# Patient Record
Sex: Male | Born: 1945 | Race: Black or African American | Hispanic: No | Marital: Married | Smoking: Current every day smoker
Health system: Southern US, Community
[De-identification: ages and names within clinical notes are randomized; demographics above are authoritative.]

## PROBLEM LIST (undated history)

## (undated) DIAGNOSIS — F1010 Alcohol abuse, uncomplicated: Secondary | ICD-10-CM

## (undated) DIAGNOSIS — I10 Essential (primary) hypertension: Secondary | ICD-10-CM

## (undated) DIAGNOSIS — Z972 Presence of dental prosthetic device (complete) (partial): Secondary | ICD-10-CM

## (undated) DIAGNOSIS — I219 Acute myocardial infarction, unspecified: Secondary | ICD-10-CM

## (undated) DIAGNOSIS — S02609A Fracture of mandible, unspecified, initial encounter for closed fracture: Secondary | ICD-10-CM

## (undated) DIAGNOSIS — R0602 Shortness of breath: Secondary | ICD-10-CM

## (undated) DIAGNOSIS — Z720 Tobacco use: Secondary | ICD-10-CM

## (undated) DIAGNOSIS — K219 Gastro-esophageal reflux disease without esophagitis: Secondary | ICD-10-CM

## (undated) DIAGNOSIS — Z973 Presence of spectacles and contact lenses: Secondary | ICD-10-CM

## (undated) HISTORY — PX: HERNIA REPAIR: SHX51

## (undated) HISTORY — PX: COLONOSCOPY: SHX174

## (undated) HISTORY — PX: OTHER SURGICAL HISTORY: SHX169

---

## 1986-11-08 DIAGNOSIS — I219 Acute myocardial infarction, unspecified: Secondary | ICD-10-CM

## 1986-11-08 HISTORY — DX: Acute myocardial infarction, unspecified: I21.9

## 1986-11-08 HISTORY — PX: CORONARY ANGIOPLASTY: SHX604

## 1998-06-29 ENCOUNTER — Emergency Department (HOSPITAL_COMMUNITY): Admission: EM | Admit: 1998-06-29 | Discharge: 1998-06-29 | Payer: Self-pay | Admitting: Emergency Medicine

## 1999-09-07 ENCOUNTER — Encounter: Payer: Self-pay | Admitting: Cardiology

## 1999-09-07 ENCOUNTER — Inpatient Hospital Stay (HOSPITAL_COMMUNITY): Admission: EM | Admit: 1999-09-07 | Discharge: 1999-09-09 | Payer: Self-pay | Admitting: Emergency Medicine

## 1999-11-16 ENCOUNTER — Ambulatory Visit (HOSPITAL_COMMUNITY): Admission: RE | Admit: 1999-11-16 | Discharge: 1999-11-16 | Payer: Self-pay | Admitting: Cardiology

## 1999-11-16 ENCOUNTER — Encounter: Payer: Self-pay | Admitting: Cardiology

## 2001-06-16 ENCOUNTER — Encounter: Payer: Self-pay | Admitting: Cardiology

## 2001-06-16 ENCOUNTER — Encounter: Admission: RE | Admit: 2001-06-16 | Discharge: 2001-06-16 | Payer: Self-pay | Admitting: Cardiology

## 2003-01-16 ENCOUNTER — Encounter: Payer: Self-pay | Admitting: Cardiology

## 2003-01-16 ENCOUNTER — Ambulatory Visit (HOSPITAL_COMMUNITY): Admission: RE | Admit: 2003-01-16 | Discharge: 2003-01-16 | Payer: Self-pay | Admitting: Cardiology

## 2004-07-08 ENCOUNTER — Encounter: Admission: RE | Admit: 2004-07-08 | Discharge: 2004-07-08 | Payer: Self-pay | Admitting: Cardiology

## 2005-05-03 ENCOUNTER — Ambulatory Visit (HOSPITAL_COMMUNITY): Admission: RE | Admit: 2005-05-03 | Discharge: 2005-05-03 | Payer: Self-pay

## 2006-08-18 ENCOUNTER — Ambulatory Visit (HOSPITAL_COMMUNITY): Admission: RE | Admit: 2006-08-18 | Discharge: 2006-08-18 | Payer: Self-pay | Admitting: Cardiology

## 2006-08-22 ENCOUNTER — Encounter: Admission: RE | Admit: 2006-08-22 | Discharge: 2006-08-22 | Payer: Self-pay | Admitting: Cardiology

## 2006-12-15 ENCOUNTER — Ambulatory Visit (HOSPITAL_COMMUNITY): Admission: RE | Admit: 2006-12-15 | Discharge: 2006-12-15 | Payer: Self-pay | Admitting: Cardiology

## 2010-11-29 ENCOUNTER — Encounter: Payer: Self-pay | Admitting: Cardiology

## 2014-02-19 ENCOUNTER — Other Ambulatory Visit: Payer: Self-pay | Admitting: Cardiology

## 2014-02-19 DIAGNOSIS — R066 Hiccough: Secondary | ICD-10-CM

## 2014-02-19 DIAGNOSIS — R1310 Dysphagia, unspecified: Secondary | ICD-10-CM

## 2014-02-20 ENCOUNTER — Ambulatory Visit
Admission: RE | Admit: 2014-02-20 | Discharge: 2014-02-20 | Disposition: A | Discharge status: Home / Self Care | Payer: Medicare PPO | Source: Ambulatory Visit | Attending: Cardiology | Admitting: Cardiology

## 2014-02-20 DIAGNOSIS — R066 Hiccough: Secondary | ICD-10-CM

## 2014-02-20 DIAGNOSIS — R1310 Dysphagia, unspecified: Secondary | ICD-10-CM

## 2014-02-25 ENCOUNTER — Other Ambulatory Visit: Payer: Self-pay | Admitting: Cardiology

## 2014-02-25 DIAGNOSIS — IMO0002 Reserved for concepts with insufficient information to code with codable children: Secondary | ICD-10-CM

## 2014-02-25 DIAGNOSIS — R229 Localized swelling, mass and lump, unspecified: Principal | ICD-10-CM

## 2014-03-01 ENCOUNTER — Encounter (INDEPENDENT_AMBULATORY_CARE_PROVIDER_SITE_OTHER): Payer: Self-pay

## 2014-03-01 ENCOUNTER — Ambulatory Visit
Admission: RE | Admit: 2014-03-01 | Discharge: 2014-03-01 | Disposition: A | Discharge status: Home / Self Care | Payer: Medicare PPO | Source: Ambulatory Visit | Attending: Cardiology | Admitting: Cardiology

## 2014-03-01 DIAGNOSIS — IMO0002 Reserved for concepts with insufficient information to code with codable children: Secondary | ICD-10-CM

## 2014-03-01 DIAGNOSIS — R229 Localized swelling, mass and lump, unspecified: Principal | ICD-10-CM

## 2014-03-20 ENCOUNTER — Encounter (HOSPITAL_COMMUNITY): Payer: Self-pay | Admitting: Pharmacy Technician

## 2014-03-20 ENCOUNTER — Other Ambulatory Visit: Payer: Self-pay | Admitting: Gastroenterology

## 2014-03-21 ENCOUNTER — Encounter (HOSPITAL_COMMUNITY): Payer: Self-pay | Admitting: *Deleted

## 2014-04-02 ENCOUNTER — Encounter (HOSPITAL_COMMUNITY): Payer: Self-pay | Admitting: *Deleted

## 2014-04-02 ENCOUNTER — Ambulatory Visit (HOSPITAL_COMMUNITY)
Admission: RE | Admit: 2014-04-02 | Discharge: 2014-04-02 | Disposition: A | Discharge status: Home / Self Care | Payer: Medicare PPO | Source: Ambulatory Visit | Attending: Gastroenterology | Admitting: Gastroenterology

## 2014-04-02 ENCOUNTER — Encounter (HOSPITAL_COMMUNITY)
Admission: RE | Disposition: A | Discharge status: Home / Self Care | Payer: Self-pay | Source: Ambulatory Visit | Attending: Gastroenterology

## 2014-04-02 DIAGNOSIS — R1310 Dysphagia, unspecified: Secondary | ICD-10-CM | POA: Insufficient documentation

## 2014-04-02 DIAGNOSIS — K449 Diaphragmatic hernia without obstruction or gangrene: Secondary | ICD-10-CM | POA: Insufficient documentation

## 2014-04-02 DIAGNOSIS — E7800 Pure hypercholesterolemia, unspecified: Secondary | ICD-10-CM | POA: Insufficient documentation

## 2014-04-02 DIAGNOSIS — I10 Essential (primary) hypertension: Secondary | ICD-10-CM | POA: Insufficient documentation

## 2014-04-02 HISTORY — DX: Gastro-esophageal reflux disease without esophagitis: K21.9

## 2014-04-02 HISTORY — PX: BALLOON DILATION: SHX5330

## 2014-04-02 HISTORY — DX: Acute myocardial infarction, unspecified: I21.9

## 2014-04-02 HISTORY — DX: Essential (primary) hypertension: I10

## 2014-04-02 HISTORY — DX: Shortness of breath: R06.02

## 2014-04-02 HISTORY — PX: ESOPHAGOGASTRODUODENOSCOPY: SHX5428

## 2014-04-02 SURGERY — EGD (ESOPHAGOGASTRODUODENOSCOPY)
Anesthesia: Moderate Sedation

## 2014-04-02 MED ORDER — MIDAZOLAM HCL 10 MG/2ML IJ SOLN
INTRAMUSCULAR | Status: DC | PRN
  Administered 2014-04-02 (×2): 2 mg via INTRAVENOUS

## 2014-04-02 MED ORDER — MIDAZOLAM HCL 10 MG/2ML IJ SOLN
INTRAMUSCULAR | Status: AC
  Filled 2014-04-02: qty 2

## 2014-04-02 MED ORDER — FENTANYL CITRATE 0.05 MG/ML IJ SOLN
INTRAMUSCULAR | Status: DC | PRN
  Administered 2014-04-02 (×2): 25 ug via INTRAVENOUS

## 2014-04-02 MED ORDER — FENTANYL CITRATE 0.05 MG/ML IJ SOLN
INTRAMUSCULAR | Status: AC
  Filled 2014-04-02: qty 2

## 2014-04-02 MED ORDER — BUTAMBEN-TETRACAINE-BENZOCAINE 2-2-14 % EX AERO
INHALATION_SPRAY | CUTANEOUS | Status: DC | PRN
  Administered 2014-04-02: 2 via TOPICAL

## 2014-04-02 MED ORDER — DIPHENHYDRAMINE HCL 50 MG/ML IJ SOLN
INTRAMUSCULAR | Status: AC
  Filled 2014-04-02: qty 1

## 2014-04-02 MED ORDER — SODIUM CHLORIDE 0.9 % IV SOLN
INTRAVENOUS | Status: DC
  Administered 2014-04-02: 500 mL via INTRAVENOUS

## 2014-04-02 NOTE — H&P (Signed)
  Problem: Dysphagia. Distal esophageal stricture seen on barium esophagram  History: The patient is a 68 year old male born July 01, 1946. He underwent a barium esophagram with barium tablet swallow to evaluate esophageal dysphagia unassociated with heartburn, odynophagia, or weight loss. Barium esophagram showed a short segment distal esophageal narrowing.  The patient is scheduled to undergo diagnostic esophagogastroduodenoscopy with distal esophageal stricture dilation.  Medication allergies: None  Chronic medications: Simvastatin. Amlodipine. Diovan. Benzapril.  Past medical and surgical history: Hypertension. Hypercholesterolemia.  Exam: The patient is alert and lying comfortably on the endoscopy stretcher. Abdomen is soft and nontender to palpation. Lungs are clear to auscultation. Cardiac exam reveals a regular rhythm.  Plan: Proceed with diagnostic esophagogastroduodenoscopy and distal esophageal stricture dilation

## 2014-04-02 NOTE — Op Note (Signed)
Problem: Dysphagia. Barium esophagram showed smooth narrowing of the esophageal lumen at the esophagogastric junction  Endoscopist: Danise Edge  Premedication: Versed 5 mg. Fentanyl 50 mcg  Procedure: Diagnostic esophagogastroduodenoscopy The patient was placed in the left lateral decubitus position. Pentax gastroscope was passed through the posterior hypopharynx into the proximal esophagus without difficulty. The hypopharynx, larynx, and vocal cords appeared normal.  Esophagoscopy: The proximal, mid, and lower segments of the esophageal mucosa appeared normal. The squamocolumnar junction was regular in appearance and noted at 40 cm from the incisor teeth. There was no endoscopic evidence for the presence of esophageal stricture formation or esophageal lumen narrowing.  Gastroscopy: Retroflex view of the gastric cardia and fundus was normal. A medium sized hiatal hernia was present. The gastric body, antrum, and pylorus appeared normal.  Duodenoscopy: The duodenal bulb and descending duodenum appeared normal.  Assessment: Normal esophagogastroduodenoscopy  Recommendation: If esophageal dysphagia persists, schedule diagnostic high resolution esophageal manometry

## 2014-04-02 NOTE — Discharge Instructions (Signed)
Gastrointestinal Endoscopy °Care After °Refer to this sheet in the next few weeks. These instructions provide you with information on caring for yourself after your procedure. Your caregiver may also give you more specific instructions. Your treatment has been planned according to current medical practices, but problems sometimes occur. Call your caregiver if you have any problems or questions after your procedure. °HOME CARE INSTRUCTIONS °· If you were given medicine to help you relax (sedative), do not drive, operate machinery, or sign important documents for 24 hours. °· Avoid alcohol and hot or warm beverages for the first 24 hours after the procedure. °· Only take over-the-counter or prescription medicines for pain, discomfort, or fever as directed by your caregiver. You may resume taking your normal medicines unless your caregiver tells you otherwise. Ask your caregiver when you may resume taking medicines that may cause bleeding, such as aspirin, clopidogrel, or warfarin. °· You may return to your normal diet and activities on the day after your procedure, or as directed by your caregiver. Walking may help to reduce any bloated feeling in your abdomen. °· Drink enough fluids to keep your urine clear or pale yellow. °· You may gargle with salt water if you have a sore throat. °SEEK IMMEDIATE MEDICAL CARE IF: °· You have severe nausea or vomiting. °· You have severe abdominal pain, abdominal cramps that last longer than 6 hours, or abdominal swelling (distention). °· You have severe shoulder or back pain. °· You have trouble swallowing. °· You have shortness of breath, your breathing is shallow, or you are breathing faster than normal. °· You have a fever or a rapid heartbeat. °· You vomit blood or material that looks like coffee grounds. °· You have bloody, black, or tarry stools. °MAKE SURE YOU: °· Understand these instructions. °· Will watch your condition. °· Will get help right away if you are not doing  well or get worse. °Document Released: 06/08/2004 Document Revised: 04/25/2012 Document Reviewed: 01/25/2012 °[employer]® Patient Information ©2014 [employer]. ° °

## 2014-04-03 ENCOUNTER — Encounter (HOSPITAL_COMMUNITY): Payer: Self-pay | Admitting: Gastroenterology

## 2014-09-30 ENCOUNTER — Other Ambulatory Visit (HOSPITAL_COMMUNITY): Payer: Self-pay | Admitting: Cardiology

## 2014-09-30 DIAGNOSIS — I10 Essential (primary) hypertension: Secondary | ICD-10-CM

## 2014-10-04 ENCOUNTER — Ambulatory Visit (HOSPITAL_COMMUNITY)
Admission: RE | Admit: 2014-10-04 | Discharge: 2014-10-04 | Disposition: A | Discharge status: Home / Self Care | Payer: Medicare PPO | Source: Ambulatory Visit | Attending: Cardiology | Admitting: Cardiology

## 2014-10-04 DIAGNOSIS — I10 Essential (primary) hypertension: Secondary | ICD-10-CM | POA: Diagnosis not present

## 2014-10-04 NOTE — Progress Notes (Signed)
  Echocardiogram 2D Echocardiogram has been performed.  Timothy Bowman, Timothy Bowman 10/04/2014, 11:14 AM

## 2014-10-07 ENCOUNTER — Ambulatory Visit (HOSPITAL_COMMUNITY): Payer: Medicare PPO

## 2015-10-03 ENCOUNTER — Emergency Department (INDEPENDENT_AMBULATORY_CARE_PROVIDER_SITE_OTHER)
Admission: EM | Admit: 2015-10-03 | Discharge: 2015-10-03 | Disposition: A | Discharge status: Home / Self Care | Payer: Medicare HMO | Source: Home / Self Care | Attending: Family Medicine | Admitting: Family Medicine

## 2015-10-03 ENCOUNTER — Encounter (HOSPITAL_COMMUNITY): Payer: Self-pay | Admitting: Emergency Medicine

## 2015-10-03 DIAGNOSIS — L0211 Cutaneous abscess of neck: Secondary | ICD-10-CM

## 2015-10-03 MED ORDER — CEPHALEXIN 500 MG PO CAPS: 500.0000 mg | ORAL_CAPSULE | Freq: Three times a day (TID) | ORAL | Status: DC

## 2015-10-03 MED ORDER — PENTAFLUOROPROP-TETRAFLUOROETH EX AERO
INHALATION_SPRAY | CUTANEOUS | Status: AC
  Filled 2015-10-03: qty 103.5

## 2015-10-03 NOTE — Discharge Instructions (Signed)
Warm compress three times a day when you take the antibiotic, take all of medicine, return as needed.

## 2015-10-03 NOTE — ED Notes (Signed)
At bedside for i/d of neck abscess.  No culture obtained

## 2015-10-03 NOTE — ED Provider Notes (Signed)
CSN: [id number]     Arrival date & time 10/03/15  1302 History   First MD Initiated Contact with Patient 10/03/15 1336     Chief Complaint  Patient presents with  . Abscess   (Consider location/radiation/quality/duration/timing/severity/associated sxs/prior Treatment) Patient is a 69 y.o. male presenting with abscess. The history is provided by the patient and the spouse.  Abscess Location:  Head/neck Head/neck abscess location:  R neck Abscess quality: draining, fluctuance, painful and redness   Red streaking: no   Duration:  1 week Progression:  Worsening Pain details:    Quality:  Sharp   Severity:  Moderate Chronicity:  New Relieved by:  None tried Worsened by:  Nothing tried   Past Medical History  Diagnosis Date  . Hypertension   . Shortness of breath     with exertion  . GERD (gastroesophageal reflux disease)   . Myocardial infarction (HCC) 1988    x 1 sees dr Bunting yearly   Past Surgical History  Procedure Laterality Date  . Coronary angioplasty  1988  . Hernia repair  yrs ago  . Surgery on collarbone for fracture  yrs ago  . Esophagogastroduodenoscopy N/A 04/02/2014    Procedure: ESOPHAGOGASTRODUODENOSCOPY (EGD) with balloon dilation;  Surgeon: Charolett BumpersMartin K Johnson, MD;  Location: [facility];  Service: Endoscopy;  Laterality: N/A;  . Balloon dilation N/A 04/02/2014    Procedure: BALLOON DILATION;  Surgeon: Charolett BumpersMartin K Johnson, MD;  Location: [facility];  Service: Endoscopy;  Laterality: N/A;   No family history on file. Social History  Substance Use Topics  . Smoking status: Current Every Day Smoker -- 0.25 packs/day for 40 years    Types: Cigarettes  . Smokeless tobacco: Never Used  . Alcohol Use: Yes     Comment: drinks twice per month    Review of Systems  Skin: Positive for rash.  Hematological: Positive for adenopathy.  All other systems reviewed and are negative.   Allergies  Review of patient's allergies indicates no known allergies.  Home  Medications   Prior to Admission medications   Medication Sig Start Date End Date Taking? Authorizing Provider  albuterol (PROVENTIL HFA;VENTOLIN HFA) 108 (90 BASE) MCG/ACT inhaler Inhale 1 puff into the lungs every 6 (six) hours as needed for wheezing or shortness of breath.    Historical Provider, MD  amLODipine (NORVASC) 10 MG tablet Take 10 mg by mouth every morning.    Historical Provider, MD  aspirin EC 81 MG tablet Take 81 mg by mouth daily.    Historical Provider, MD  benazepril (LOTENSIN) 40 MG tablet Take 40 mg by mouth every morning.    Historical Provider, MD  cephALEXin (KEFLEX) 500 MG capsule Take 1 capsule (500 mg total) by mouth 3 (three) times daily. Take all of medicine and drink lots of fluids 10/03/15   Linna HoffJames D Mazi Schuff, MD  cholecalciferol (VITAMIN D) 1000 UNITS tablet Take 1,000 Units by mouth daily.    Historical Provider, MD  omega-3 acid ethyl esters (LOVAZA) 1 G capsule Take 1 g by mouth daily.    Historical Provider, MD  simvastatin (ZOCOR) 40 MG tablet Take 40 mg by mouth daily.    Historical Provider, MD  valsartan (DIOVAN) 160 MG tablet Take 160 mg by mouth every morning.    Historical Provider, MD   Meds Ordered and Administered this Visit  Medications - No data to display  BP 107/78 mmHg  Pulse 101  Temp(Src) 97.6 F (36.4 C) (Oral)  SpO2 97% No data found.  Physical Exam  Constitutional: He appears well-developed and well-nourished. No distress.  Neck: Normal range of motion. Neck supple.  Skin: Skin is warm and dry. There is erythema.  Tender fluct sts, draining purulent fluid.  Nursing note and vitals reviewed.   ED Course  .Marland KitchenIncision and Drainage Date/Time: 10/03/2015 1:58 PM Performed by: Linna Hoff Authorized by: Bradd Canary D Consent: Verbal consent obtained. Consent given by: patient Patient identity confirmed: verbally with patient Type: abscess Body area: neck Location details: right posterior neck Local anesthetic: topical  anesthetic Scalpel size: 11 Incision type: elliptical Complexity: simple Drainage: purulent Drainage amount: moderate Wound treatment: wound left open Packing material: none Patient tolerance: Patient tolerated the procedure well with no immediate complications   (including critical care time)  Labs Review Labs Reviewed - No data to display  Imaging Review No results found.   Visual Acuity Review  Right Eye Distance:   Left Eye Distance:   Bilateral Distance:    Right Eye Near:   Left Eye Near:    Bilateral Near:         MDM   1. Cutaneous abscess of neck        Linna Hoff, MD 10/03/15 [phone]

## 2015-10-03 NOTE — ED Notes (Signed)
Abscess to neck

## 2016-02-08 IMAGING — RF DG ESOPHAGUS
18 of 24 series · 18 of 24 positions shown · non-contrast
Comparison: None.

CLINICAL DATA: hiccups.  Reflux.

EXAM:
ESOPHOGRAM/BARIUM SWALLOW
TECHNIQUE: Combined double contrast and single contrast examination performed
using effervescent crystals, thick barium liquid, and thin barium
liquid.
FLUOROSCOPY TIME:  Dictate in minutes & seconds

[Series 1: run · 1 of 10 slices shown (1 of 18)]
[im 1/10]
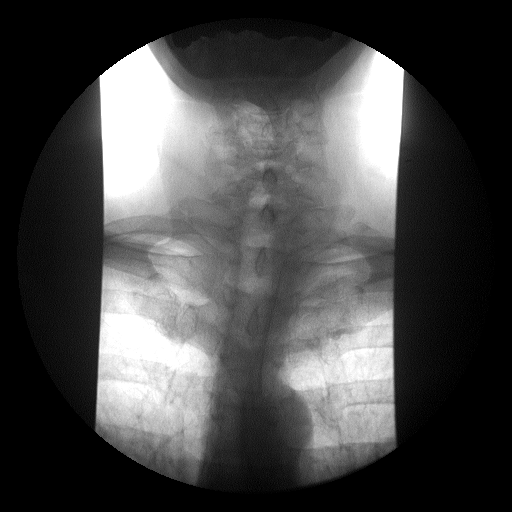

[Series 3: run · 1 of 20 slices shown (2 of 18)]
[im 1/20]
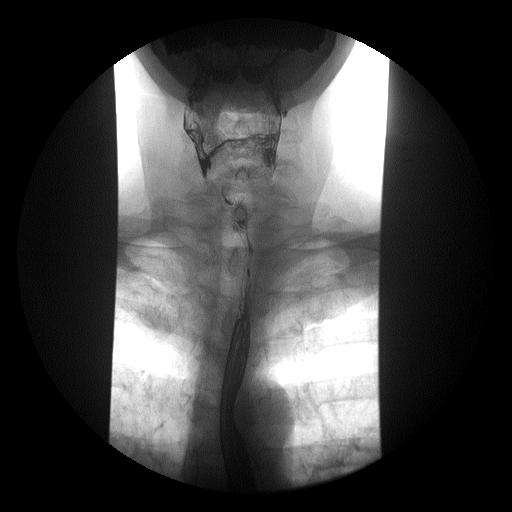

[Series 4: run · 1 of 17 slices shown (3 of 18)]
[im 1/17]
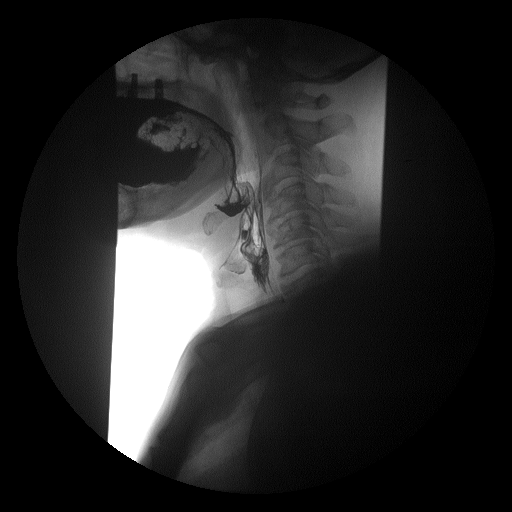

[Series 5: run · 1 of 1 slices shown (4 of 18)]
[im 1/1]
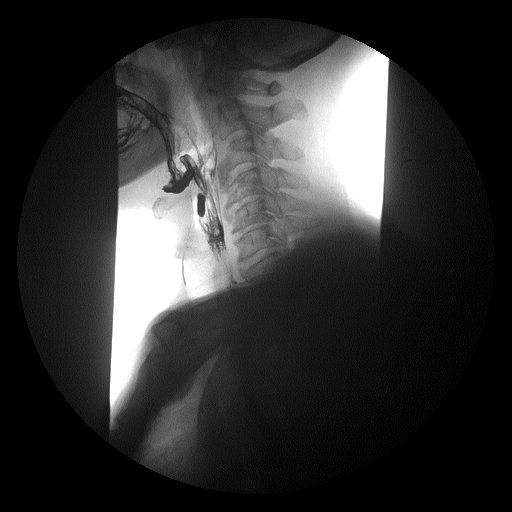

[Series 7: run · 1 of 1 slices shown (5 of 18)]
[im 1/1]
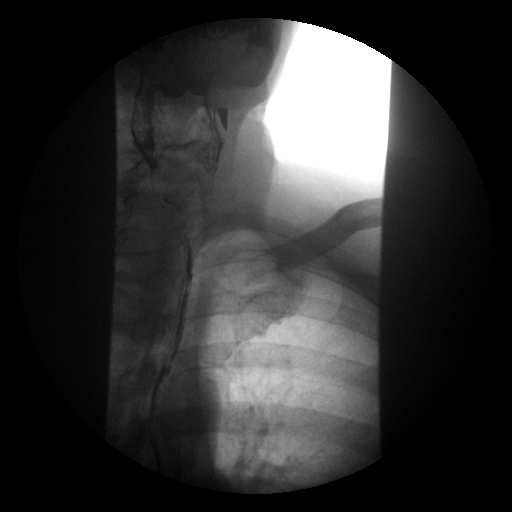

[Series 8: run · 1 of 1 slices shown (6 of 18)]
[im 1/1]
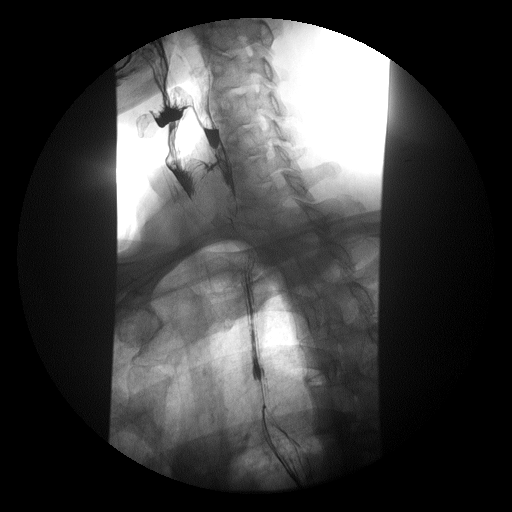

[Series 9: run · 1 of 1 slices shown (7 of 18)]
[im 1/1]
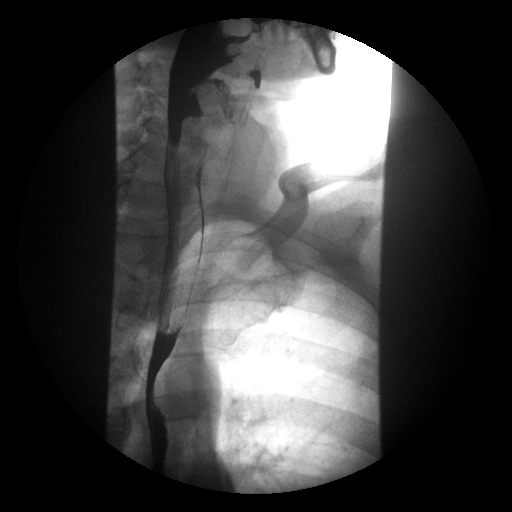

[Series 11: run · 1 of 25 slices shown (8 of 18)]
[im 1/25]
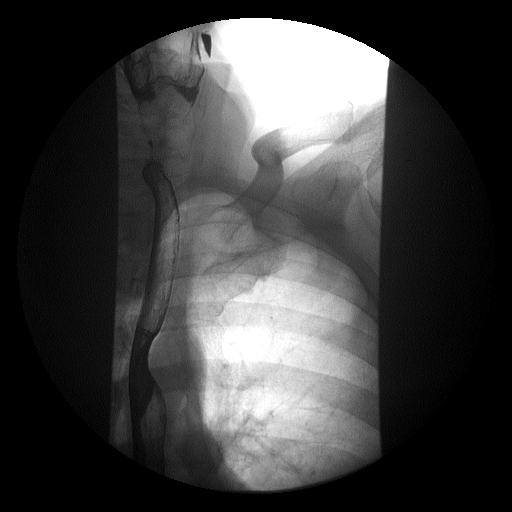

[Series 12: run · 1 of 1 slices shown (9 of 18)]
[im 1/1]
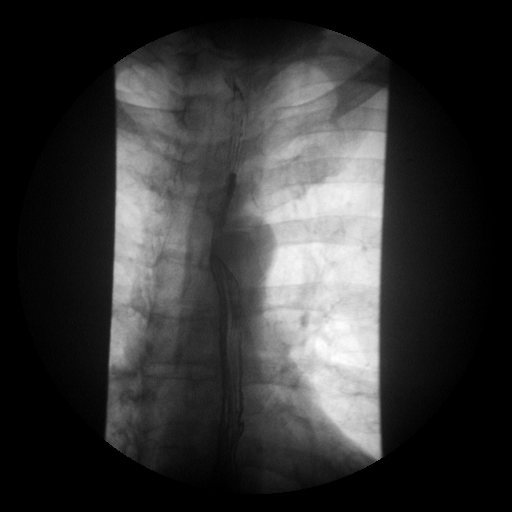

[Series 13: run · 1 of 1 slices shown (10 of 18)]
[im 1/1]
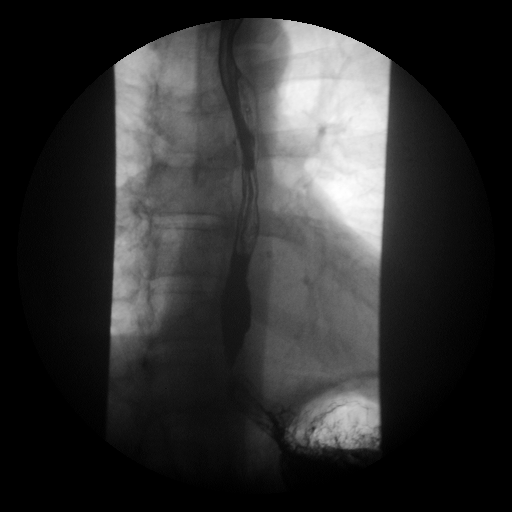

[Series 15: run · 1 of 1 slices shown (11 of 18)]
[im 1/1]
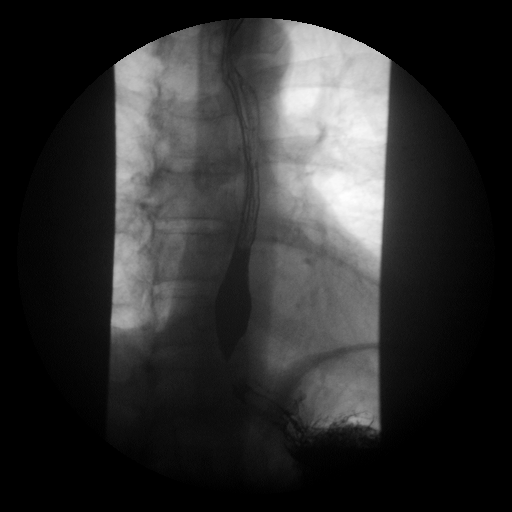

[Series 16: run · 1 of 1 slices shown (12 of 18)]
[im 1/1]
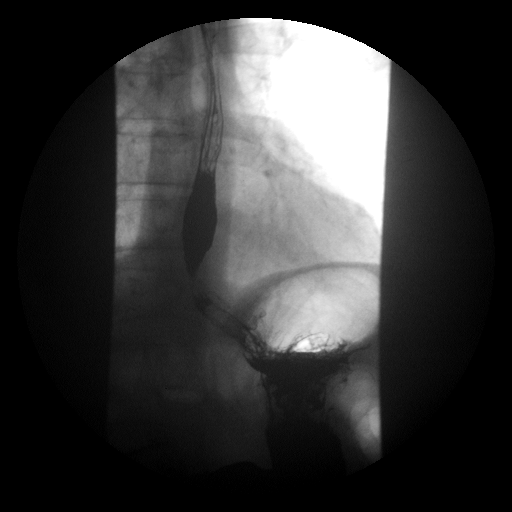

[Series 17: run · 1 of 1 slices shown (13 of 18)]
[im 1/1]
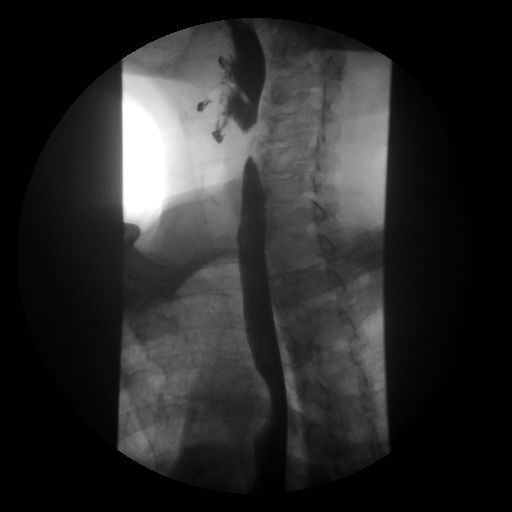

[Series 19: run · 1 of 1 slices shown (14 of 18)]
[im 1/1]
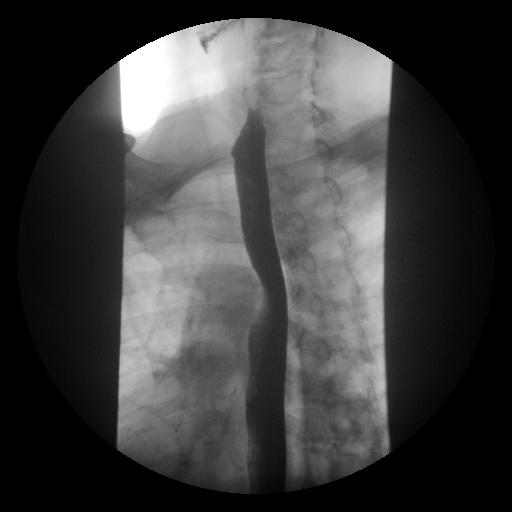

[Series 20: run · 1 of 1 slices shown (15 of 18)]
[im 1/1]
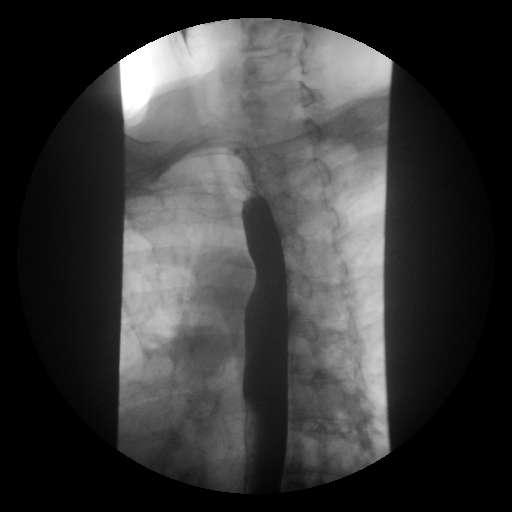

[Series 21: run · 1 of 1 slices shown (16 of 18)]
[im 1/1]
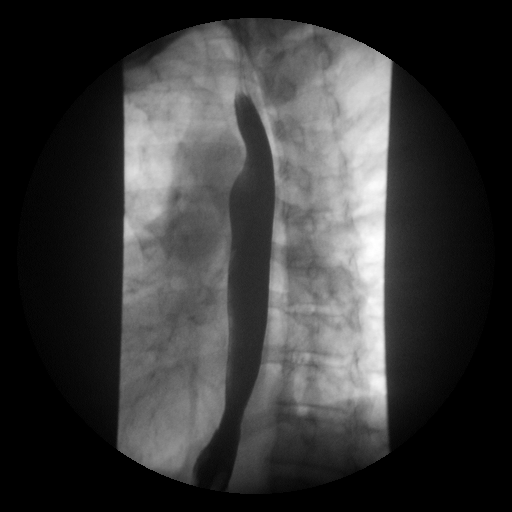

[Series 23: run · 1 of 1 slices shown (17 of 18)]
[im 1/1]
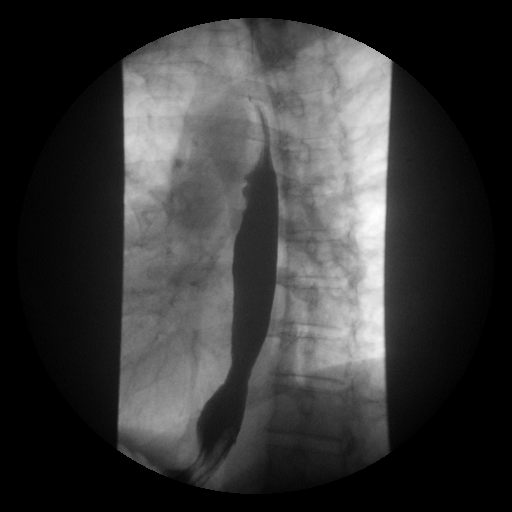

[Series 24: run · 1 of 1 slices shown (18 of 18)]
[im 1/1]
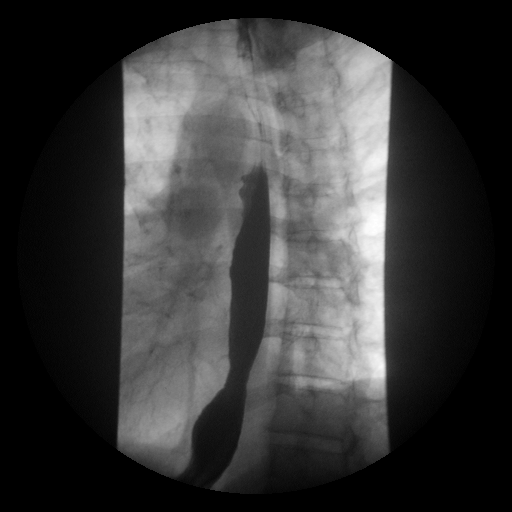

[18 of 24 positions shown; findings below may reference images not displayed]

FINDINGS: Frontal and lateral views of the hypopharynx while swallowing show a
outpouching from the left aspect of the larynx which may represent a
meningocele. Asymmetric mass-effect is identified in the region of
the left hypopharynx, creating a filling defect on the frontal
projections, despite repeated swallows. No abnormality is evident in
this area on the lateral views.

A trace amount of aspiration was identified on the fourth or fifth
swallowing. Given that this occurred only after multiple swallows
and represented a trace amount of barium, continued imaging was
performed with close observation of the proximal trachea.

Double contrast imaging was limited by poor esophageal distention.
No evidence for esophageal diverticulum.

Oblique supine positioning was used for evaluation of esophageal
motility. There is disruption of primary peristalsis on all
swallows, consistent with nonspecific esophageal motility disorder.
There is a tiny hiatal hernia noted. Smooth, concentric, regular
narrowing of the distal esophagus is identified, just proximal to
the esophagogastric junction. There is no abrupt shouldering or wall
irregularity in the region of stricture.

13 mm barium tablet becomes lodged in the distal esophagus, just
proximal to the narrowed segment and remains there despite repeated
swallows of water.
IMPRESSION: Apparent mass-effect in the left hypopharynx. Hypo pharyngeal mass
lesion is a concern. Dedicated CT imaging of the neck with contrast
is recommended to further evaluate. If endoscopy is performed to
assess the distal esophageal stricture described immediately below,
this area could be further evaluated at the time of endoscopic
evaluation.

Short segment of smooth distal esophageal narrowing prevents passage
of the 13 mm barium tablet. Barrett's esophagus would be a
consideration. Upper endoscopy may prove helpful to further
evaluate.

A trace amount of aspiration was observed on 1 swallow during the
study. No cough reflex was elicited.

These results will be called to the ordering clinician or
representative by the Radiologist Assistant, and communication
documented in the PACS Dashboard.

## 2017-02-15 ENCOUNTER — Other Ambulatory Visit: Payer: Self-pay | Admitting: Cardiology

## 2017-02-15 DIAGNOSIS — R079 Chest pain, unspecified: Secondary | ICD-10-CM

## 2017-02-23 ENCOUNTER — Ambulatory Visit (HOSPITAL_COMMUNITY)
Admission: RE | Admit: 2017-02-23 | Discharge: 2017-02-23 | Disposition: A | Discharge status: Home / Self Care | Payer: Medicare HMO | Source: Ambulatory Visit | Attending: Cardiology | Admitting: Cardiology

## 2017-02-23 DIAGNOSIS — R079 Chest pain, unspecified: Secondary | ICD-10-CM

## 2017-03-09 ENCOUNTER — Ambulatory Visit (HOSPITAL_COMMUNITY)
Admission: RE | Admit: 2017-03-09 | Discharge: 2017-03-09 | Disposition: A | Discharge status: Home / Self Care | Payer: Medicare HMO | Source: Ambulatory Visit | Attending: Cardiology | Admitting: Cardiology

## 2017-03-09 DIAGNOSIS — R931 Abnormal findings on diagnostic imaging of heart and coronary circulation: Secondary | ICD-10-CM | POA: Diagnosis not present

## 2017-03-09 DIAGNOSIS — R079 Chest pain, unspecified: Secondary | ICD-10-CM | POA: Diagnosis present

## 2017-03-09 MED ORDER — REGADENOSON 0.4 MG/5ML IV SOLN
0.4000 mg | Freq: Once | INTRAVENOUS | Status: AC
  Administered 2017-03-09: 0.4 mg via INTRAVENOUS

## 2017-03-09 MED ORDER — TECHNETIUM TC 99M TETROFOSMIN IV KIT
10.0000 | PACK | Freq: Once | INTRAVENOUS | Status: AC | PRN
  Administered 2017-03-09: 10 via INTRAVENOUS

## 2017-03-09 MED ORDER — REGADENOSON 0.4 MG/5ML IV SOLN
INTRAVENOUS | Status: AC
  Filled 2017-03-09: qty 5

## 2017-03-11 DIAGNOSIS — R079 Chest pain, unspecified: Secondary | ICD-10-CM | POA: Diagnosis not present

## 2017-03-11 MED ORDER — REGADENOSON 0.4 MG/5ML IV SOLN
0.4000 mg | Freq: Once | INTRAVENOUS | Status: AC
  Administered 2017-03-11: 0.4 mg via INTRAVENOUS

## 2017-03-11 MED ORDER — REGADENOSON 0.4 MG/5ML IV SOLN
INTRAVENOUS | Status: AC
  Filled 2017-03-11: qty 5

## 2017-03-11 MED ORDER — TECHNETIUM TC 99M TETROFOSMIN IV KIT
30.0000 | PACK | Freq: Once | INTRAVENOUS | Status: AC | PRN
  Administered 2017-03-11: 30 via INTRAVENOUS

## 2017-03-14 DIAGNOSIS — R079 Chest pain, unspecified: Secondary | ICD-10-CM | POA: Diagnosis not present

## 2017-05-03 DIAGNOSIS — E785 Hyperlipidemia, unspecified: Secondary | ICD-10-CM | POA: Diagnosis not present

## 2017-05-03 DIAGNOSIS — I10 Essential (primary) hypertension: Secondary | ICD-10-CM | POA: Diagnosis not present

## 2017-05-03 DIAGNOSIS — Z7982 Long term (current) use of aspirin: Secondary | ICD-10-CM | POA: Diagnosis not present

## 2017-05-03 DIAGNOSIS — F17210 Nicotine dependence, cigarettes, uncomplicated: Secondary | ICD-10-CM | POA: Diagnosis not present

## 2017-05-03 DIAGNOSIS — R7989 Other specified abnormal findings of blood chemistry: Secondary | ICD-10-CM | POA: Diagnosis not present

## 2017-05-03 DIAGNOSIS — Z79899 Other long term (current) drug therapy: Secondary | ICD-10-CM | POA: Diagnosis not present

## 2017-05-03 DIAGNOSIS — E860 Dehydration: Secondary | ICD-10-CM | POA: Diagnosis not present

## 2017-05-23 DIAGNOSIS — I2510 Atherosclerotic heart disease of native coronary artery without angina pectoris: Secondary | ICD-10-CM | POA: Diagnosis not present

## 2017-05-23 DIAGNOSIS — E785 Hyperlipidemia, unspecified: Secondary | ICD-10-CM | POA: Diagnosis not present

## 2017-05-23 DIAGNOSIS — I252 Old myocardial infarction: Secondary | ICD-10-CM | POA: Diagnosis not present

## 2017-05-23 DIAGNOSIS — M1990 Unspecified osteoarthritis, unspecified site: Secondary | ICD-10-CM | POA: Diagnosis not present

## 2017-05-23 DIAGNOSIS — F17290 Nicotine dependence, other tobacco product, uncomplicated: Secondary | ICD-10-CM | POA: Diagnosis not present

## 2017-05-23 DIAGNOSIS — I10 Essential (primary) hypertension: Secondary | ICD-10-CM | POA: Diagnosis not present

## 2017-08-25 DIAGNOSIS — Z716 Tobacco abuse counseling: Secondary | ICD-10-CM | POA: Diagnosis not present

## 2017-08-25 DIAGNOSIS — I2510 Atherosclerotic heart disease of native coronary artery without angina pectoris: Secondary | ICD-10-CM | POA: Diagnosis not present

## 2017-08-25 DIAGNOSIS — I252 Old myocardial infarction: Secondary | ICD-10-CM | POA: Diagnosis not present

## 2017-08-25 DIAGNOSIS — E785 Hyperlipidemia, unspecified: Secondary | ICD-10-CM | POA: Diagnosis not present

## 2017-08-25 DIAGNOSIS — F17290 Nicotine dependence, other tobacco product, uncomplicated: Secondary | ICD-10-CM | POA: Diagnosis not present

## 2017-08-25 DIAGNOSIS — I10 Essential (primary) hypertension: Secondary | ICD-10-CM | POA: Diagnosis not present

## 2017-08-25 DIAGNOSIS — M1990 Unspecified osteoarthritis, unspecified site: Secondary | ICD-10-CM | POA: Diagnosis not present

## 2017-08-25 DIAGNOSIS — Z720 Tobacco use: Secondary | ICD-10-CM | POA: Diagnosis not present

## 2017-10-12 DIAGNOSIS — M1990 Unspecified osteoarthritis, unspecified site: Secondary | ICD-10-CM | POA: Diagnosis not present

## 2017-10-12 DIAGNOSIS — I252 Old myocardial infarction: Secondary | ICD-10-CM | POA: Diagnosis not present

## 2017-10-12 DIAGNOSIS — E785 Hyperlipidemia, unspecified: Secondary | ICD-10-CM | POA: Diagnosis not present

## 2017-10-12 DIAGNOSIS — I2510 Atherosclerotic heart disease of native coronary artery without angina pectoris: Secondary | ICD-10-CM | POA: Diagnosis not present

## 2017-10-12 DIAGNOSIS — Z716 Tobacco abuse counseling: Secondary | ICD-10-CM | POA: Diagnosis not present

## 2017-10-12 DIAGNOSIS — F17290 Nicotine dependence, other tobacco product, uncomplicated: Secondary | ICD-10-CM | POA: Diagnosis not present

## 2017-10-12 DIAGNOSIS — Z720 Tobacco use: Secondary | ICD-10-CM | POA: Diagnosis not present

## 2017-10-12 DIAGNOSIS — I10 Essential (primary) hypertension: Secondary | ICD-10-CM | POA: Diagnosis not present

## 2017-10-26 DIAGNOSIS — F17290 Nicotine dependence, other tobacco product, uncomplicated: Secondary | ICD-10-CM | POA: Diagnosis not present

## 2017-10-26 DIAGNOSIS — E785 Hyperlipidemia, unspecified: Secondary | ICD-10-CM | POA: Diagnosis not present

## 2017-10-26 DIAGNOSIS — I2510 Atherosclerotic heart disease of native coronary artery without angina pectoris: Secondary | ICD-10-CM | POA: Diagnosis not present

## 2017-10-26 DIAGNOSIS — M1990 Unspecified osteoarthritis, unspecified site: Secondary | ICD-10-CM | POA: Diagnosis not present

## 2017-10-26 DIAGNOSIS — I10 Essential (primary) hypertension: Secondary | ICD-10-CM | POA: Diagnosis not present

## 2017-10-26 DIAGNOSIS — I252 Old myocardial infarction: Secondary | ICD-10-CM | POA: Diagnosis not present

## 2018-01-11 DIAGNOSIS — Z720 Tobacco use: Secondary | ICD-10-CM | POA: Diagnosis not present

## 2018-01-11 DIAGNOSIS — I10 Essential (primary) hypertension: Secondary | ICD-10-CM | POA: Diagnosis not present

## 2018-01-11 DIAGNOSIS — I2510 Atherosclerotic heart disease of native coronary artery without angina pectoris: Secondary | ICD-10-CM | POA: Diagnosis not present

## 2018-01-11 DIAGNOSIS — F17290 Nicotine dependence, other tobacco product, uncomplicated: Secondary | ICD-10-CM | POA: Diagnosis not present

## 2018-01-11 DIAGNOSIS — E785 Hyperlipidemia, unspecified: Secondary | ICD-10-CM | POA: Diagnosis not present

## 2018-01-11 DIAGNOSIS — Z716 Tobacco abuse counseling: Secondary | ICD-10-CM | POA: Diagnosis not present

## 2018-01-11 DIAGNOSIS — M1990 Unspecified osteoarthritis, unspecified site: Secondary | ICD-10-CM | POA: Diagnosis not present

## 2018-04-12 ENCOUNTER — Other Ambulatory Visit: Payer: Self-pay | Admitting: Cardiology

## 2018-04-12 DIAGNOSIS — E785 Hyperlipidemia, unspecified: Secondary | ICD-10-CM | POA: Diagnosis not present

## 2018-04-12 DIAGNOSIS — M1990 Unspecified osteoarthritis, unspecified site: Secondary | ICD-10-CM | POA: Diagnosis not present

## 2018-04-12 DIAGNOSIS — R079 Chest pain, unspecified: Secondary | ICD-10-CM

## 2018-04-12 DIAGNOSIS — I10 Essential (primary) hypertension: Secondary | ICD-10-CM | POA: Diagnosis not present

## 2018-04-12 DIAGNOSIS — I2510 Atherosclerotic heart disease of native coronary artery without angina pectoris: Secondary | ICD-10-CM | POA: Diagnosis not present

## 2018-04-12 DIAGNOSIS — I252 Old myocardial infarction: Secondary | ICD-10-CM | POA: Diagnosis not present

## 2018-04-12 DIAGNOSIS — F17290 Nicotine dependence, other tobacco product, uncomplicated: Secondary | ICD-10-CM | POA: Diagnosis not present

## 2018-04-12 DIAGNOSIS — Z720 Tobacco use: Secondary | ICD-10-CM | POA: Diagnosis not present

## 2018-04-12 DIAGNOSIS — R0609 Other forms of dyspnea: Secondary | ICD-10-CM | POA: Diagnosis not present

## 2018-04-12 DIAGNOSIS — Z716 Tobacco abuse counseling: Secondary | ICD-10-CM | POA: Diagnosis not present

## 2018-04-24 ENCOUNTER — Encounter (HOSPITAL_COMMUNITY)
Admission: RE | Admit: 2018-04-24 | Discharge: 2018-04-24 | Disposition: A | Discharge status: Home / Self Care | Payer: Medicare HMO | Source: Ambulatory Visit | Attending: Cardiology | Admitting: Cardiology

## 2018-04-24 DIAGNOSIS — R0602 Shortness of breath: Secondary | ICD-10-CM | POA: Diagnosis not present

## 2018-04-24 DIAGNOSIS — E785 Hyperlipidemia, unspecified: Secondary | ICD-10-CM | POA: Diagnosis not present

## 2018-04-24 DIAGNOSIS — I252 Old myocardial infarction: Secondary | ICD-10-CM | POA: Diagnosis not present

## 2018-04-24 DIAGNOSIS — F17290 Nicotine dependence, other tobacco product, uncomplicated: Secondary | ICD-10-CM | POA: Diagnosis not present

## 2018-04-24 DIAGNOSIS — R9431 Abnormal electrocardiogram [ECG] [EKG]: Secondary | ICD-10-CM | POA: Diagnosis not present

## 2018-04-24 DIAGNOSIS — R0609 Other forms of dyspnea: Secondary | ICD-10-CM | POA: Diagnosis not present

## 2018-04-24 DIAGNOSIS — I10 Essential (primary) hypertension: Secondary | ICD-10-CM | POA: Diagnosis not present

## 2018-04-24 DIAGNOSIS — R079 Chest pain, unspecified: Secondary | ICD-10-CM | POA: Diagnosis not present

## 2018-04-24 DIAGNOSIS — I2510 Atherosclerotic heart disease of native coronary artery without angina pectoris: Secondary | ICD-10-CM | POA: Diagnosis not present

## 2018-04-24 MED ORDER — TECHNETIUM TC 99M TETROFOSMIN IV KIT
10.0000 | PACK | Freq: Once | INTRAVENOUS | Status: AC | PRN
  Administered 2018-04-24: 10 via INTRAVENOUS

## 2018-04-24 MED ORDER — REGADENOSON 0.4 MG/5ML IV SOLN
0.4000 mg | Freq: Once | INTRAVENOUS | Status: AC
  Administered 2018-04-24: 0.4 mg via INTRAVENOUS

## 2018-04-24 MED ORDER — TECHNETIUM TC 99M TETROFOSMIN IV KIT
30.0000 | PACK | Freq: Once | INTRAVENOUS | Status: AC | PRN
  Administered 2018-04-24: 30 via INTRAVENOUS

## 2018-04-24 MED ORDER — REGADENOSON 0.4 MG/5ML IV SOLN
INTRAVENOUS | Status: AC
  Filled 2018-04-24: qty 5

## 2018-07-19 DIAGNOSIS — F17290 Nicotine dependence, other tobacco product, uncomplicated: Secondary | ICD-10-CM | POA: Diagnosis not present

## 2018-07-19 DIAGNOSIS — Z716 Tobacco abuse counseling: Secondary | ICD-10-CM | POA: Diagnosis not present

## 2018-07-19 DIAGNOSIS — E785 Hyperlipidemia, unspecified: Secondary | ICD-10-CM | POA: Diagnosis not present

## 2018-07-19 DIAGNOSIS — M1990 Unspecified osteoarthritis, unspecified site: Secondary | ICD-10-CM | POA: Diagnosis not present

## 2018-07-19 DIAGNOSIS — I10 Essential (primary) hypertension: Secondary | ICD-10-CM | POA: Diagnosis not present

## 2018-07-19 DIAGNOSIS — I2510 Atherosclerotic heart disease of native coronary artery without angina pectoris: Secondary | ICD-10-CM | POA: Diagnosis not present

## 2018-07-19 DIAGNOSIS — I252 Old myocardial infarction: Secondary | ICD-10-CM | POA: Diagnosis not present

## 2018-09-07 DIAGNOSIS — M1990 Unspecified osteoarthritis, unspecified site: Secondary | ICD-10-CM | POA: Diagnosis not present

## 2018-09-07 DIAGNOSIS — B029 Zoster without complications: Secondary | ICD-10-CM | POA: Diagnosis not present

## 2018-09-07 DIAGNOSIS — F17290 Nicotine dependence, other tobacco product, uncomplicated: Secondary | ICD-10-CM | POA: Diagnosis not present

## 2018-09-07 DIAGNOSIS — I2510 Atherosclerotic heart disease of native coronary artery without angina pectoris: Secondary | ICD-10-CM | POA: Diagnosis not present

## 2018-09-07 DIAGNOSIS — E785 Hyperlipidemia, unspecified: Secondary | ICD-10-CM | POA: Diagnosis not present

## 2018-09-07 DIAGNOSIS — I252 Old myocardial infarction: Secondary | ICD-10-CM | POA: Diagnosis not present

## 2018-09-07 DIAGNOSIS — I10 Essential (primary) hypertension: Secondary | ICD-10-CM | POA: Diagnosis not present

## 2018-10-18 DIAGNOSIS — E785 Hyperlipidemia, unspecified: Secondary | ICD-10-CM | POA: Diagnosis not present

## 2018-10-18 DIAGNOSIS — I2510 Atherosclerotic heart disease of native coronary artery without angina pectoris: Secondary | ICD-10-CM | POA: Diagnosis not present

## 2018-10-18 DIAGNOSIS — I10 Essential (primary) hypertension: Secondary | ICD-10-CM | POA: Diagnosis not present

## 2018-10-18 DIAGNOSIS — M1990 Unspecified osteoarthritis, unspecified site: Secondary | ICD-10-CM | POA: Diagnosis not present

## 2018-10-18 DIAGNOSIS — I252 Old myocardial infarction: Secondary | ICD-10-CM | POA: Diagnosis not present

## 2018-10-18 DIAGNOSIS — F17290 Nicotine dependence, other tobacco product, uncomplicated: Secondary | ICD-10-CM | POA: Diagnosis not present

## 2019-01-17 DIAGNOSIS — I2510 Atherosclerotic heart disease of native coronary artery without angina pectoris: Secondary | ICD-10-CM | POA: Diagnosis not present

## 2019-01-17 DIAGNOSIS — I252 Old myocardial infarction: Secondary | ICD-10-CM | POA: Diagnosis not present

## 2019-01-17 DIAGNOSIS — M1990 Unspecified osteoarthritis, unspecified site: Secondary | ICD-10-CM | POA: Diagnosis not present

## 2019-01-17 DIAGNOSIS — E785 Hyperlipidemia, unspecified: Secondary | ICD-10-CM | POA: Diagnosis not present

## 2019-01-17 DIAGNOSIS — F17290 Nicotine dependence, other tobacco product, uncomplicated: Secondary | ICD-10-CM | POA: Diagnosis not present

## 2019-01-17 DIAGNOSIS — I10 Essential (primary) hypertension: Secondary | ICD-10-CM | POA: Diagnosis not present

## 2019-04-18 DIAGNOSIS — E785 Hyperlipidemia, unspecified: Secondary | ICD-10-CM | POA: Diagnosis not present

## 2019-04-18 DIAGNOSIS — I10 Essential (primary) hypertension: Secondary | ICD-10-CM | POA: Diagnosis not present

## 2019-04-18 DIAGNOSIS — I2510 Atherosclerotic heart disease of native coronary artery without angina pectoris: Secondary | ICD-10-CM | POA: Diagnosis not present

## 2019-04-18 DIAGNOSIS — F17290 Nicotine dependence, other tobacco product, uncomplicated: Secondary | ICD-10-CM | POA: Diagnosis not present

## 2019-04-18 DIAGNOSIS — M1990 Unspecified osteoarthritis, unspecified site: Secondary | ICD-10-CM | POA: Diagnosis not present

## 2019-04-18 DIAGNOSIS — I252 Old myocardial infarction: Secondary | ICD-10-CM | POA: Diagnosis not present

## 2019-07-18 DIAGNOSIS — I2510 Atherosclerotic heart disease of native coronary artery without angina pectoris: Secondary | ICD-10-CM | POA: Diagnosis not present

## 2019-07-18 DIAGNOSIS — I10 Essential (primary) hypertension: Secondary | ICD-10-CM | POA: Diagnosis not present

## 2019-07-18 DIAGNOSIS — F17290 Nicotine dependence, other tobacco product, uncomplicated: Secondary | ICD-10-CM | POA: Diagnosis not present

## 2019-07-18 DIAGNOSIS — E785 Hyperlipidemia, unspecified: Secondary | ICD-10-CM | POA: Diagnosis not present

## 2019-07-18 DIAGNOSIS — M1990 Unspecified osteoarthritis, unspecified site: Secondary | ICD-10-CM | POA: Diagnosis not present

## 2019-08-28 ENCOUNTER — Emergency Department (HOSPITAL_COMMUNITY): Payer: Medicare HMO

## 2019-08-28 ENCOUNTER — Observation Stay (HOSPITAL_COMMUNITY)
Admission: EM | Admit: 2019-08-28 | Discharge: 2019-08-29 | Disposition: A | Discharge status: Home / Self Care | Payer: Medicare HMO | Attending: Internal Medicine | Admitting: Internal Medicine

## 2019-08-28 ENCOUNTER — Other Ambulatory Visit: Payer: Self-pay

## 2019-08-28 DIAGNOSIS — S02609A Fracture of mandible, unspecified, initial encounter for closed fracture: Secondary | ICD-10-CM | POA: Diagnosis not present

## 2019-08-28 DIAGNOSIS — K219 Gastro-esophageal reflux disease without esophagitis: Secondary | ICD-10-CM | POA: Insufficient documentation

## 2019-08-28 DIAGNOSIS — F17210 Nicotine dependence, cigarettes, uncomplicated: Secondary | ICD-10-CM | POA: Insufficient documentation

## 2019-08-28 DIAGNOSIS — W19XXXA Unspecified fall, initial encounter: Secondary | ICD-10-CM | POA: Diagnosis present

## 2019-08-28 DIAGNOSIS — D696 Thrombocytopenia, unspecified: Secondary | ICD-10-CM | POA: Diagnosis not present

## 2019-08-28 DIAGNOSIS — S0181XA Laceration without foreign body of other part of head, initial encounter: Secondary | ICD-10-CM | POA: Insufficient documentation

## 2019-08-28 DIAGNOSIS — R9431 Abnormal electrocardiogram [ECG] [EKG]: Secondary | ICD-10-CM | POA: Insufficient documentation

## 2019-08-28 DIAGNOSIS — S2232XA Fracture of one rib, left side, initial encounter for closed fracture: Principal | ICD-10-CM | POA: Insufficient documentation

## 2019-08-28 DIAGNOSIS — Z79899 Other long term (current) drug therapy: Secondary | ICD-10-CM | POA: Diagnosis not present

## 2019-08-28 DIAGNOSIS — T07XXXA Unspecified multiple injuries, initial encounter: Secondary | ICD-10-CM | POA: Diagnosis not present

## 2019-08-28 DIAGNOSIS — F1020 Alcohol dependence, uncomplicated: Secondary | ICD-10-CM | POA: Diagnosis present

## 2019-08-28 DIAGNOSIS — S2239XA Fracture of one rib, unspecified side, initial encounter for closed fracture: Secondary | ICD-10-CM | POA: Diagnosis present

## 2019-08-28 DIAGNOSIS — W109XXA Fall (on) (from) unspecified stairs and steps, initial encounter: Secondary | ICD-10-CM | POA: Insufficient documentation

## 2019-08-28 DIAGNOSIS — Z23 Encounter for immunization: Secondary | ICD-10-CM | POA: Insufficient documentation

## 2019-08-28 DIAGNOSIS — Z20828 Contact with and (suspected) exposure to other viral communicable diseases: Secondary | ICD-10-CM | POA: Insufficient documentation

## 2019-08-28 DIAGNOSIS — M4802 Spinal stenosis, cervical region: Secondary | ICD-10-CM | POA: Insufficient documentation

## 2019-08-28 DIAGNOSIS — R0902 Hypoxemia: Secondary | ICD-10-CM | POA: Diagnosis not present

## 2019-08-28 DIAGNOSIS — S02611A Fracture of condylar process of right mandible, initial encounter for closed fracture: Secondary | ICD-10-CM | POA: Diagnosis not present

## 2019-08-28 DIAGNOSIS — Z03818 Encounter for observation for suspected exposure to other biological agents ruled out: Secondary | ICD-10-CM | POA: Diagnosis not present

## 2019-08-28 DIAGNOSIS — I252 Old myocardial infarction: Secondary | ICD-10-CM | POA: Diagnosis not present

## 2019-08-28 DIAGNOSIS — S2221XA Fracture of manubrium, initial encounter for closed fracture: Secondary | ICD-10-CM | POA: Diagnosis not present

## 2019-08-28 DIAGNOSIS — S3993XA Unspecified injury of pelvis, initial encounter: Secondary | ICD-10-CM | POA: Diagnosis not present

## 2019-08-28 DIAGNOSIS — S02601A Fracture of unspecified part of body of right mandible, initial encounter for closed fracture: Secondary | ICD-10-CM

## 2019-08-28 DIAGNOSIS — G9520 Unspecified cord compression: Secondary | ICD-10-CM | POA: Insufficient documentation

## 2019-08-28 DIAGNOSIS — R102 Pelvic and perineal pain: Secondary | ICD-10-CM | POA: Diagnosis not present

## 2019-08-28 DIAGNOSIS — R0602 Shortness of breath: Secondary | ICD-10-CM | POA: Diagnosis not present

## 2019-08-28 DIAGNOSIS — R58 Hemorrhage, not elsewhere classified: Secondary | ICD-10-CM | POA: Diagnosis not present

## 2019-08-28 DIAGNOSIS — J439 Emphysema, unspecified: Secondary | ICD-10-CM | POA: Diagnosis not present

## 2019-08-28 DIAGNOSIS — M50322 Other cervical disc degeneration at C5-C6 level: Secondary | ICD-10-CM | POA: Insufficient documentation

## 2019-08-28 DIAGNOSIS — S199XXA Unspecified injury of neck, initial encounter: Secondary | ICD-10-CM | POA: Diagnosis not present

## 2019-08-28 DIAGNOSIS — R079 Chest pain, unspecified: Secondary | ICD-10-CM | POA: Diagnosis not present

## 2019-08-28 DIAGNOSIS — S299XXA Unspecified injury of thorax, initial encounter: Secondary | ICD-10-CM | POA: Diagnosis not present

## 2019-08-28 DIAGNOSIS — J9601 Acute respiratory failure with hypoxia: Secondary | ICD-10-CM | POA: Diagnosis not present

## 2019-08-28 DIAGNOSIS — I10 Essential (primary) hypertension: Secondary | ICD-10-CM | POA: Diagnosis not present

## 2019-08-28 DIAGNOSIS — E876 Hypokalemia: Secondary | ICD-10-CM | POA: Insufficient documentation

## 2019-08-28 DIAGNOSIS — Z7982 Long term (current) use of aspirin: Secondary | ICD-10-CM | POA: Diagnosis not present

## 2019-08-28 DIAGNOSIS — Z720 Tobacco use: Secondary | ICD-10-CM | POA: Diagnosis present

## 2019-08-28 DIAGNOSIS — F1010 Alcohol abuse, uncomplicated: Secondary | ICD-10-CM | POA: Diagnosis present

## 2019-08-28 HISTORY — DX: Alcohol abuse, uncomplicated: F10.10

## 2019-08-28 HISTORY — DX: Fracture of mandible, unspecified, initial encounter for closed fracture: S02.609A

## 2019-08-28 HISTORY — DX: Tobacco use: Z72.0

## 2019-08-28 LAB — CBC WITH DIFFERENTIAL/PLATELET
Abs Immature Granulocytes: 0.05 10*3/uL (ref 0.00–0.07)
Basophils Absolute: 0 10*3/uL (ref 0.0–0.1)
Basophils Relative: 0 %
Eosinophils Absolute: 0.1 10*3/uL (ref 0.0–0.5)
Eosinophils Relative: 1 %
HCT: 45.5 % (ref 39.0–52.0)
Hemoglobin: 16.2 g/dL (ref 13.0–17.0)
Immature Granulocytes: 0 %
Lymphocytes Relative: 11 %
Lymphs Abs: 1.4 10*3/uL (ref 0.7–4.0)
MCH: 31.6 pg (ref 26.0–34.0)
MCHC: 35.6 g/dL (ref 30.0–36.0)
MCV: 88.9 fL (ref 80.0–100.0)
Monocytes Absolute: 0.7 10*3/uL (ref 0.1–1.0)
Monocytes Relative: 5 %
Neutro Abs: 10.5 10*3/uL — ABNORMAL HIGH (ref 1.7–7.7)
Neutrophils Relative %: 83 %
Platelets: 145 10*3/uL — ABNORMAL LOW (ref 150–400)
RBC: 5.12 MIL/uL (ref 4.22–5.81)
RDW: 13.7 % (ref 11.5–15.5)
WBC: 12.7 10*3/uL — ABNORMAL HIGH (ref 4.0–10.5)
nRBC: 0 % (ref 0.0–0.2)

## 2019-08-28 LAB — COMPREHENSIVE METABOLIC PANEL
ALT: 30 U/L (ref 0–44)
AST: 55 U/L — ABNORMAL HIGH (ref 15–41)
Albumin: 3.3 g/dL — ABNORMAL LOW (ref 3.5–5.0)
Alkaline Phosphatase: 53 U/L (ref 38–126)
Anion gap: 15 (ref 5–15)
BUN: 21 mg/dL (ref 8–23)
CO2: 21 mmol/L — ABNORMAL LOW (ref 22–32)
Calcium: 9.7 mg/dL (ref 8.9–10.3)
Chloride: 102 mmol/L (ref 98–111)
Creatinine, Ser: 1.17 mg/dL (ref 0.61–1.24)
GFR calc Af Amer: >60 mL/min (ref >60)
GFR calc non Af Amer: >60 mL/min (ref >60)
Glucose, Bld: 106 mg/dL — ABNORMAL HIGH (ref 70–99)
Potassium: 3 mmol/L — ABNORMAL LOW (ref 3.5–5.1)
Sodium: 138 mmol/L (ref 135–145)
Total Bilirubin: 0.6 mg/dL (ref 0.3–1.2)
Total Protein: 6.8 g/dL (ref 6.5–8.1)

## 2019-08-28 LAB — ETHANOL: Alcohol, Ethyl (B): 145 mg/dL — ABNORMAL HIGH (ref <10)

## 2019-08-28 MED ORDER — VITAMIN D 25 MCG (1000 UNIT) PO TABS
1000.0000 [IU] | ORAL_TABLET | Freq: Every day | ORAL | Status: DC
  Administered 2019-08-29: 10:00:00 1000 [IU] via ORAL
  Filled 2019-08-28: qty 1

## 2019-08-28 MED ORDER — CHLORHEXIDINE GLUCONATE 0.12 % MT SOLN
5.0000 mL | Freq: Once | OROMUCOSAL | Status: AC
  Administered 2019-08-28: 22:00:00 5 mL via OROMUCOSAL
  Filled 2019-08-28: qty 15

## 2019-08-28 MED ORDER — ONDANSETRON HCL 4 MG/2ML IJ SOLN
4.0000 mg | Freq: Once | INTRAMUSCULAR | Status: AC
  Administered 2019-08-28: 4 mg via INTRAVENOUS
  Filled 2019-08-28: qty 2

## 2019-08-28 MED ORDER — ALBUTEROL (5 MG/ML) CONTINUOUS INHALATION SOLN
5.0000 mg | INHALATION_SOLUTION | RESPIRATORY_TRACT | Status: DC | PRN
  Filled 2019-08-28: qty 20

## 2019-08-28 MED ORDER — FENTANYL CITRATE (PF) 100 MCG/2ML IJ SOLN
50.0000 ug | Freq: Once | INTRAMUSCULAR | Status: AC
  Administered 2019-08-28: 50 ug via INTRAVENOUS
  Filled 2019-08-28: qty 2

## 2019-08-28 MED ORDER — METHYLPREDNISOLONE SODIUM SUCC 125 MG IJ SOLR
125.0000 mg | Freq: Once | INTRAMUSCULAR | Status: AC
  Administered 2019-08-29: 125 mg via INTRAVENOUS
  Filled 2019-08-28: qty 2

## 2019-08-28 MED ORDER — FOLIC ACID 1 MG PO TABS
1.0000 mg | ORAL_TABLET | Freq: Every day | ORAL | Status: DC
  Administered 2019-08-29: 10:00:00 1 mg via ORAL
  Filled 2019-08-28: qty 1

## 2019-08-28 MED ORDER — LORAZEPAM 1 MG PO TABS: 0.0000 mg | ORAL_TABLET | Freq: Two times a day (BID) | ORAL | Status: DC

## 2019-08-28 MED ORDER — BACITRACIN ZINC 500 UNIT/GM EX OINT
TOPICAL_OINTMENT | Freq: Once | CUTANEOUS | Status: AC
  Administered 2019-08-28: 21:00:00 via TOPICAL
  Filled 2019-08-28: qty 0.9

## 2019-08-28 MED ORDER — ACETAMINOPHEN 325 MG PO TABS: 650.0000 mg | ORAL_TABLET | Freq: Four times a day (QID) | ORAL | Status: DC | PRN

## 2019-08-28 MED ORDER — SODIUM CHLORIDE 0.9 % IV BOLUS
1000.0000 mL | Freq: Once | INTRAVENOUS | Status: AC
  Administered 2019-08-28: 20:00:00 1000 mL via INTRAVENOUS

## 2019-08-28 MED ORDER — ACETAMINOPHEN 650 MG RE SUPP: 650.0000 mg | Freq: Four times a day (QID) | RECTAL | Status: DC | PRN

## 2019-08-28 MED ORDER — THIAMINE HCL 100 MG/ML IJ SOLN
100.0000 mg | Freq: Every day | INTRAMUSCULAR | Status: DC
  Filled 2019-08-28: qty 2

## 2019-08-28 MED ORDER — PREDNISONE 20 MG PO TABS
40.0000 mg | ORAL_TABLET | Freq: Every day | ORAL | Status: DC
  Administered 2019-08-29: 08:00:00 40 mg via ORAL
  Filled 2019-08-28: qty 2

## 2019-08-28 MED ORDER — OMEGA-3-ACID ETHYL ESTERS 1 G PO CAPS
1.0000 g | ORAL_CAPSULE | Freq: Every day | ORAL | Status: DC
  Administered 2019-08-29: 1 g via ORAL
  Filled 2019-08-28 (×2): qty 1

## 2019-08-28 MED ORDER — VITAMIN B-1 100 MG PO TABS
100.0000 mg | ORAL_TABLET | Freq: Every day | ORAL | Status: DC
  Administered 2019-08-28 – 2019-08-29 (×2): 100 mg via ORAL
  Filled 2019-08-28 (×2): qty 1

## 2019-08-28 MED ORDER — IPRATROPIUM-ALBUTEROL 0.5-2.5 (3) MG/3ML IN SOLN
3.0000 mL | Freq: Four times a day (QID) | RESPIRATORY_TRACT | Status: DC
  Administered 2019-08-29 (×2): 3 mL via RESPIRATORY_TRACT
  Filled 2019-08-28 (×3): qty 3

## 2019-08-28 MED ORDER — LORAZEPAM 2 MG/ML IJ SOLN: 0.0000 mg | Freq: Two times a day (BID) | INTRAMUSCULAR | Status: DC

## 2019-08-28 MED ORDER — AZITHROMYCIN 250 MG PO TABS
250.0000 mg | ORAL_TABLET | Freq: Every day | ORAL | Status: DC
  Administered 2019-08-29: 250 mg via ORAL
  Filled 2019-08-28: qty 1

## 2019-08-28 MED ORDER — SIMVASTATIN 20 MG PO TABS: 40.0000 mg | ORAL_TABLET | Freq: Every day | ORAL | Status: DC

## 2019-08-28 MED ORDER — ALBUTEROL SULFATE HFA 108 (90 BASE) MCG/ACT IN AERS
4.0000 | INHALATION_SPRAY | Freq: Once | RESPIRATORY_TRACT | Status: AC
  Administered 2019-08-28: 21:00:00 4 via RESPIRATORY_TRACT
  Filled 2019-08-28: qty 6.7

## 2019-08-28 MED ORDER — POTASSIUM CHLORIDE CRYS ER 20 MEQ PO TBCR
40.0000 meq | EXTENDED_RELEASE_TABLET | Freq: Once | ORAL | Status: AC
  Administered 2019-08-29: 40 meq via ORAL
  Filled 2019-08-28: qty 2

## 2019-08-28 MED ORDER — LORAZEPAM 2 MG/ML IJ SOLN: 0.0000 mg | Freq: Four times a day (QID) | INTRAMUSCULAR | Status: DC

## 2019-08-28 MED ORDER — ADULT MULTIVITAMIN W/MINERALS CH
1.0000 | ORAL_TABLET | Freq: Every day | ORAL | Status: DC
  Administered 2019-08-29: 10:00:00 1 via ORAL
  Filled 2019-08-28: qty 1

## 2019-08-28 MED ORDER — AZITHROMYCIN 250 MG PO TABS
500.0000 mg | ORAL_TABLET | Freq: Once | ORAL | Status: AC
  Administered 2019-08-29: 500 mg via ORAL
  Filled 2019-08-28: qty 2

## 2019-08-28 MED ORDER — LORAZEPAM 1 MG PO TABS: 0.0000 mg | ORAL_TABLET | Freq: Four times a day (QID) | ORAL | Status: DC

## 2019-08-28 MED ORDER — LIDOCAINE-EPINEPHRINE (PF) 2 %-1:200000 IJ SOLN
10.0000 mL | Freq: Once | INTRAMUSCULAR | Status: AC
  Administered 2019-08-28: 10 mL via INTRADERMAL
  Filled 2019-08-28: qty 20

## 2019-08-28 MED ORDER — TETANUS-DIPHTH-ACELL PERTUSSIS 5-2.5-18.5 LF-MCG/0.5 IM SUSP
0.5000 mL | Freq: Once | INTRAMUSCULAR | Status: AC
  Administered 2019-08-28: 19:00:00 0.5 mL via INTRAMUSCULAR
  Filled 2019-08-28: qty 0.5

## 2019-08-28 NOTE — ED Notes (Signed)
Patient transported to CT 

## 2019-08-28 NOTE — ED Notes (Signed)
While changing Pt into hospital gown, pt became tachyphemic w/ increase work of breathing, oxygen saturation decreased to 76% on RA. Pt repositioned and O2 increased to 93% before this RN departed the room. EDP notified.

## 2019-08-28 NOTE — ED Triage Notes (Signed)
Pt endorses falling yesterday as well after drinking 3oz liquor.

## 2019-08-28 NOTE — H&P (Addendum)
History and Physical    Timothy Bowman WJX:[id number] DOB: 1946/09/16 DOA: 08/28/2019  PCP: Rinaldo Cloud, MD Patient coming from: Home  Chief Complaint: Fall  HPI: Timothy Bowman is a 73 y.o. male with medical history significant of COPD, hypertension, GERD, MI presenting to the hospital via EMS for evaluation after a fall.  Patient states he had 3 ounces of liquor and while going down steps lost balance and fell.  Denies any pain since the fall.  Denies rib or jaw pain.  Denies headache, neck pain, chest pain, abdominal pain, or pain anywhere else.  States he drinks liquor almost every day.  Does report having dyspnea and wheezing for the past few days.  States he has COPD and uses albuterol inhaler as needed.  Per ED provider's conversation with the patient's wife: "I discussed with wife who arrived at the ED.  She states that patient fell down approximately 13 steps.  She reports that she was at home when this occurred.  She states that he was at the top of the steps and fell and when he got to the bottom of steps, he hit a cabinet and thinks that is what caused the laceration to his chin and tongue.  She states that she did witness the fall and did not note any seizure activity.  She states patient did not have any LOC.  He is not on blood thinners.  She states that patient was at his normal baseline.  She thinks he fell as a combination of his drinking and losing balance."  ED Course: Noted to have laceration to his chin and tongue.  In addition, blood noted in his right ear.  Afebrile.  Blood pressure soft.  Slightly tachypneic.  Oxygen saturation down to 83 to 86% on room air and improved to 90 to 92%.  RN reported additional hypoxia with oxygen saturation going down to 76%.  Noted to have significant wheezing.  White blood cell count 12.7.  Platelet count 145,000, no prior labs for comparison.  Potassium 3.0.  UA pending.  Blood ethanol level 145.  SARS-CoV-2 test pending.  Head CT and CT  C-spine negative for acute finding.  CT maxillofacial showing comminuted displaced fracture of the right mandibular condyle.  Hairline fracture of the left mandibular body just posterior to the last remaining tooth in the left mandibular body.  Chronic opacification of the mastoid air cells and middle ear cavities bilaterally.  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  Chest x-ray showing acute appearing nondisplaced fracture of the left anterolateral ninth rib.  No pneumothorax.  X-ray of pelvis negative for acute finding.  Patient received albuterol MDI treatment, fentanyl, Zofran, Tdap injection, and 1 L fluid bolus.  Review of Systems:  All systems reviewed and apart from history of presenting illness, are negative.  Past Medical History:  Diagnosis Date   GERD (gastroesophageal reflux disease)    Hypertension    Myocardial infarction (HCC) 1988   x 1 sees dr Godman yearly   Shortness of breath    with exertion    Past Surgical History:  Procedure Laterality Date   BALLOON DILATION N/A 04/02/2014   Procedure: BALLOON DILATION;  Surgeon: Charolett Bumpers, MD;  Location: [facility];  Service: Endoscopy;  Laterality: N/A;   CORONARY ANGIOPLASTY  1988   ESOPHAGOGASTRODUODENOSCOPY N/A 04/02/2014   Procedure: ESOPHAGOGASTRODUODENOSCOPY (EGD) with balloon dilation;  Surgeon: Charolett Bumpers, MD;  Location: [facility];  Service: Endoscopy;  Laterality: N/A;   HERNIA REPAIR  yrs ago   surgery on collarbone for fracture  yrs ago     reports that he has been smoking cigarettes. He has a 10.00 pack-year smoking history. He has never used smokeless tobacco. He reports current alcohol use. He reports that he does not use drugs.  No Known Allergies  History reviewed. No pertinent family history.  Prior to Admission medications   Medication Sig Start Date End Date Taking? Authorizing Provider  albuterol (PROVENTIL  HFA;VENTOLIN HFA) 108 (90 BASE) MCG/ACT inhaler Inhale 1 puff into the lungs every 6 (six) hours as needed for wheezing or shortness of breath.    [provider]  amLODipine (NORVASC) 10 MG tablet Take 10 mg by mouth every morning.    [provider]  aspirin EC 81 MG tablet Take 81 mg by mouth daily.    [provider]  benazepril (LOTENSIN) 40 MG tablet Take 40 mg by mouth every morning.    [provider]  cephALEXin (KEFLEX) 500 MG capsule Take 1 capsule (500 mg total) by mouth 3 (three) times daily. Take all of medicine and drink lots of fluids Patient not taking: Reported on 04/24/2018 10/03/15   Linna HoffKindl, James D, MD  cholecalciferol (VITAMIN D) 1000 UNITS tablet Take 1,000 Units by mouth daily.    [provider]  omega-3 acid ethyl esters (LOVAZA) 1 G capsule Take 1 g by mouth daily.    [provider]  simvastatin (ZOCOR) 40 MG tablet Take 40 mg by mouth daily.    [provider]  valsartan (DIOVAN) 160 MG tablet Take 160 mg by mouth every morning.    [provider]    Physical Exam: Vitals:   08/29/19 0430 08/29/19 0600 08/29/19 0630 08/29/19 0700  BP: 128/72 (!) 141/78 (!) 141/69 139/73  Pulse: 84 82 74 79  Resp: 19  19 20   Temp:      TempSrc:      SpO2: 90% 96% 94% 96%    Physical Exam  Constitutional: He is oriented to person, place, and time. No distress.  HENT:  Head: Normocephalic.  Forehead abrasion Tongue laceration, not actively bleeding Chin laceration, sutures in place no active bleeding  Eyes: Pupils are equal, round, and reactive to light. Right eye exhibits no discharge. Left eye exhibits no discharge.  Neck: Neck supple.  Cardiovascular: Normal rate, regular rhythm and intact distal pulses.  Pulmonary/Chest: Effort normal. He has wheezes. He has no rales.  On 2 L supplemental oxygen Speaking clearly in full sentences Diffuse end expiratory wheezing  Abdominal: Soft. Bowel sounds are  normal. He exhibits no distension. There is no abdominal tenderness. There is no guarding.  Musculoskeletal:        General: No edema.  Neurological: He is alert and oriented to person, place, and time.  Skin: Skin is warm and dry. He is not diaphoretic.   Labs on Admission: I have personally reviewed following labs and imaging studies  CBC: Recent Labs  Lab 08/28/19 1935 08/29/19 0339  WBC 12.7* 9.2  NEUTROABS 10.5*  --   HGB 16.2 14.6  HCT 45.5 41.9  MCV 88.9 90.1  PLT 145* 117*   Basic Metabolic Panel: Recent Labs  Lab 08/28/19 1935 08/29/19 0339  NA 138 141  K 3.0* 3.7  CL 102 107  CO2 21* 23  GLUCOSE 106* 130*  BUN 21 24*  CREATININE 1.17 1.17  CALCIUM 9.7 9.4  MG  --  1.5*   GFR: CrCl cannot be calculated (Unknown ideal weight.). Liver Function Tests: Recent Labs  Lab 08/28/19 1935  AST 55*  ALT 30  ALKPHOS 53  BILITOT 0.6  PROT 6.8  ALBUMIN 3.3*   No results for input(s): LIPASE, AMYLASE in the last 168 hours. No results for input(s): AMMONIA in the last 168 hours. Coagulation Profile: No results for input(s): INR, PROTIME in the last 168 hours. Cardiac Enzymes: No results for input(s): CKTOTAL, CKMB, CKMBINDEX, TROPONINI in the last 168 hours. BNP (last 3 results) No results for input(s): PROBNP in the last 8760 hours. HbA1C: No results for input(s): HGBA1C in the last 72 hours. CBG: No results for input(s): GLUCAP in the last 168 hours. Lipid Profile: No results for input(s): CHOL, HDL, LDLCALC, TRIG, CHOLHDL, LDLDIRECT in the last 72 hours. Thyroid Function Tests: No results for input(s): TSH, T4TOTAL, FREET4, T3FREE, THYROIDAB in the last 72 hours. Anemia Panel: No results for input(s): VITAMINB12, FOLATE, FERRITIN, TIBC, IRON, RETICCTPCT in the last 72 hours. Urine analysis: No results found for: COLORURINE, APPEARANCEUR, LABSPEC, PHURINE, GLUCOSEU, HGBUR, BILIRUBINUR, KETONESUR, PROTEINUR, UROBILINOGEN, NITRITE,  LEUKOCYTESUR  Radiological Exams on Admission: Dg Chest 2 View  Result Date: 08/28/2019 CLINICAL DATA:  Initial evaluation for acute pain status post recent fall. EXAM: CHEST - 2 VIEW COMPARISON:  Prior radiograph from 08/22/2006. FINDINGS: Cardiac and mediastinal silhouettes are within normal limits. Lungs normally inflated. Mild chronic coarsening of the interstitial markings. No focal infiltrates, edema, or effusion. No pneumothorax. There is an acute appearing nondisplaced fracture of the left anterolateral ninth rib. No other acute osseous abnormality. IMPRESSION: 1. Acute appearing nondisplaced fracture of the left anterolateral ninth rib. Correlation with physical exam recommended. 2. No other active cardiopulmonary disease. No pneumothorax. Electronically Signed   By: Rise Mu M.D.   On: 08/28/2019 19:43   Dg Pelvis 1-2 Views  Result Date: 08/28/2019 CLINICAL DATA:  Initial evaluation for acute pain status post recent fall. EXAM: PELVIS - 1-2 VIEW COMPARISON:  None. FINDINGS: No acute fracture dislocation. No pubic diastasis. SI joints approximated. Femoral heads in normal position within the acetabula. No visible acute soft tissue injury. Mild to moderate osteoarthritic changes about the hips bilaterally. Degenerative changes noted within the lower lumbar spine. IMPRESSION: No acute osseous abnormality about the pelvis. Electronically Signed   By: Rise Mu M.D.   On: 08/28/2019 19:46   Ct Head Wo Contrast  Result Date: 08/28/2019 CLINICAL DATA:  Head and face trauma secondary to a fall at home. Headache. EXAM: CT HEAD WITHOUT CONTRAST CT MAXILLOFACIAL WITHOUT CONTRAST CT CERVICAL SPINE WITHOUT CONTRAST TECHNIQUE: Multidetector CT imaging of the head, cervical spine, and maxillofacial structures were performed using the standard protocol without intravenous contrast. Multiplanar CT image reconstructions of the cervical spine and maxillofacial structures were also  generated. COMPARISON:  CT scan of the head dated 08/22/2006 and CT scan of the neck dated 03/01/2014 FINDINGS: CT HEAD FINDINGS Brain: No evidence of acute infarction, hemorrhage, hydrocephalus, extra-axial collection or mass lesion/mass effect. Since the prior study of 2007 the patient has developed diffuse mild cerebral cortical atrophy with secondary ventricular dilatation. There is also new slight periventricular white matter lucency consistent with small vessel ischemic disease. Vascular: No hyperdense vessel or unexpected calcification. Skull: Normal. Negative for fracture or focal lesion. Other: Chronic opacification of the mastoid air cells with partial opacification of the middle ear cavities, chronic. The patient has developed mucosal thickening in the bases of both maxillary sinuses since the prior  study. Orbits appear normal. CT MAXILLOFACIAL FINDINGS Osseous: There is a comminuted displaced fracture of the right mandibular condyle. There is also a hairline fracture through the left mandibular body just posterior to the last remaining tooth in the left mandibular body. Orbits: Old blowout fracture of floor of the left orbit. Old slight deformity of the left zygomatic arch consistent with an old fracture. Sinuses: Slight mucosal thickening in the maxillary sinuses. Chronic opacification of the mastoid air cells and chronic slight partial opacification of the middle ear cavities. Soft tissues: Negative. CT CERVICAL SPINE FINDINGS Alignment: Normal. Skull base and vertebrae: No acute fracture. No primary bone lesion or focal pathologic process. Soft tissues and spinal canal: No prevertebral fluid or swelling. No visible canal hematoma. Disc levels: C2-3: Slight bilateral facet arthritis. No disc bulging or protrusion. C3-4: Anterior osteophytes fuse the C3-4 level. No disc bulging or protrusion. Foraminal or spinal stenosis. C4-5: Marked disc space narrowing. Broad-based disc osteophyte complex slightly  narrows the right lateral recess. No significant foraminal stenosis. Central disc bulge which compresses the ventral aspect of the spinal cord. AP dimension of the cord at that level is approximately 6 mm. C5-6: Disc space narrowing. Central disc protrusion with prominent osteophytes to the right and left. The spinal cord is compressed to a minimum dimension of approximately 5 mm. Bilateral lateral recess stenosis and moderately severe bilateral foraminal stenosis. C6-7: Broad-based disc osteophyte complex without focal neural impingement. C7-T1: Tiny central disc bulge with no neural impingement. T1-2: Negative. T2-3: Negative. Upper chest: Emphysema. Other: None IMPRESSION: 1. No acute intracranial abnormality. 2. No acute abnormality of the cervical spine. 3. Comminuted displaced fracture of the right mandibular condyle. 4. Hairline fracture of the left mandibular body just posterior to the last remaining tooth in the left mandibular body. 5. Chronic opacification of the mastoid air cells and middle ear cavities bilaterally. 6. 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 Electronically Signed   By: Francene Boyers M.D.   On: 08/28/2019 19:49   Ct Cervical Spine Wo Contrast  Result Date: 08/28/2019 CLINICAL DATA:  Head and face trauma secondary to a fall at home. Headache. EXAM: CT HEAD WITHOUT CONTRAST CT MAXILLOFACIAL WITHOUT CONTRAST CT CERVICAL SPINE WITHOUT CONTRAST TECHNIQUE: Multidetector CT imaging of the head, cervical spine, and maxillofacial structures were performed using the standard protocol without intravenous contrast. Multiplanar CT image reconstructions of the cervical spine and maxillofacial structures were also generated. COMPARISON:  CT scan of the head dated 08/22/2006 and CT scan of the neck dated 03/01/2014 FINDINGS: CT HEAD FINDINGS Brain: No evidence of acute infarction, hemorrhage, hydrocephalus, extra-axial collection or  mass lesion/mass effect. Since the prior study of 2007 the patient has developed diffuse mild cerebral cortical atrophy with secondary ventricular dilatation. There is also new slight periventricular white matter lucency consistent with small vessel ischemic disease. Vascular: No hyperdense vessel or unexpected calcification. Skull: Normal. Negative for fracture or focal lesion. Other: Chronic opacification of the mastoid air cells with partial opacification of the middle ear cavities, chronic. The patient has developed mucosal thickening in the bases of both maxillary sinuses since the prior study. Orbits appear normal. CT MAXILLOFACIAL FINDINGS Osseous: There is a comminuted displaced fracture of the right mandibular condyle. There is also a hairline fracture through the left mandibular body just posterior to the last remaining tooth in the left mandibular body. Orbits: Old blowout fracture of floor of the left orbit. Old slight deformity of  the left zygomatic arch consistent with an old fracture. Sinuses: Slight mucosal thickening in the maxillary sinuses. Chronic opacification of the mastoid air cells and chronic slight partial opacification of the middle ear cavities. Soft tissues: Negative. CT CERVICAL SPINE FINDINGS Alignment: Normal. Skull base and vertebrae: No acute fracture. No primary bone lesion or focal pathologic process. Soft tissues and spinal canal: No prevertebral fluid or swelling. No visible canal hematoma. Disc levels: C2-3: Slight bilateral facet arthritis. No disc bulging or protrusion. C3-4: Anterior osteophytes fuse the C3-4 level. No disc bulging or protrusion. Foraminal or spinal stenosis. C4-5: Marked disc space narrowing. Broad-based disc osteophyte complex slightly narrows the right lateral recess. No significant foraminal stenosis. Central disc bulge which compresses the ventral aspect of the spinal cord. AP dimension of the cord at that level is approximately 6 mm. C5-6: Disc space  narrowing. Central disc protrusion with prominent osteophytes to the right and left. The spinal cord is compressed to a minimum dimension of approximately 5 mm. Bilateral lateral recess stenosis and moderately severe bilateral foraminal stenosis. C6-7: Broad-based disc osteophyte complex without focal neural impingement. C7-T1: Tiny central disc bulge with no neural impingement. T1-2: Negative. T2-3: Negative. Upper chest: Emphysema. Other: None IMPRESSION: 1. No acute intracranial abnormality. 2. No acute abnormality of the cervical spine. 3. Comminuted displaced fracture of the right mandibular condyle. 4. Hairline fracture of the left mandibular body just posterior to the last remaining tooth in the left mandibular body. 5. Chronic opacification of the mastoid air cells and middle ear cavities bilaterally. 6. 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 Electronically Signed   By: Francene Boyers M.D.   On: 08/28/2019 19:49   Ct Maxillofacial Wo Contrast  Result Date: 08/28/2019 CLINICAL DATA:  Head and face trauma secondary to a fall at home. Headache. EXAM: CT HEAD WITHOUT CONTRAST CT MAXILLOFACIAL WITHOUT CONTRAST CT CERVICAL SPINE WITHOUT CONTRAST TECHNIQUE: Multidetector CT imaging of the head, cervical spine, and maxillofacial structures were performed using the standard protocol without intravenous contrast. Multiplanar CT image reconstructions of the cervical spine and maxillofacial structures were also generated. COMPARISON:  CT scan of the head dated 08/22/2006 and CT scan of the neck dated 03/01/2014 FINDINGS: CT HEAD FINDINGS Brain: No evidence of acute infarction, hemorrhage, hydrocephalus, extra-axial collection or mass lesion/mass effect. Since the prior study of 2007 the patient has developed diffuse mild cerebral cortical atrophy with secondary ventricular dilatation. There is also new slight periventricular white matter lucency  consistent with small vessel ischemic disease. Vascular: No hyperdense vessel or unexpected calcification. Skull: Normal. Negative for fracture or focal lesion. Other: Chronic opacification of the mastoid air cells with partial opacification of the middle ear cavities, chronic. The patient has developed mucosal thickening in the bases of both maxillary sinuses since the prior study. Orbits appear normal. CT MAXILLOFACIAL FINDINGS Osseous: There is a comminuted displaced fracture of the right mandibular condyle. There is also a hairline fracture through the left mandibular body just posterior to the last remaining tooth in the left mandibular body. Orbits: Old blowout fracture of floor of the left orbit. Old slight deformity of the left zygomatic arch consistent with an old fracture. Sinuses: Slight mucosal thickening in the maxillary sinuses. Chronic opacification of the mastoid air cells and chronic slight partial opacification of the middle ear cavities. Soft tissues: Negative. CT CERVICAL SPINE FINDINGS Alignment: Normal. Skull base and vertebrae: No acute fracture. No primary bone lesion or focal pathologic  process. Soft tissues and spinal canal: No prevertebral fluid or swelling. No visible canal hematoma. Disc levels: C2-3: Slight bilateral facet arthritis. No disc bulging or protrusion. C3-4: Anterior osteophytes fuse the C3-4 level. No disc bulging or protrusion. Foraminal or spinal stenosis. C4-5: Marked disc space narrowing. Broad-based disc osteophyte complex slightly narrows the right lateral recess. No significant foraminal stenosis. Central disc bulge which compresses the ventral aspect of the spinal cord. AP dimension of the cord at that level is approximately 6 mm. C5-6: Disc space narrowing. Central disc protrusion with prominent osteophytes to the right and left. The spinal cord is compressed to a minimum dimension of approximately 5 mm. Bilateral lateral recess stenosis and moderately severe  bilateral foraminal stenosis. C6-7: Broad-based disc osteophyte complex without focal neural impingement. C7-T1: Tiny central disc bulge with no neural impingement. T1-2: Negative. T2-3: Negative. Upper chest: Emphysema. Other: None IMPRESSION: 1. No acute intracranial abnormality. 2. No acute abnormality of the cervical spine. 3. Comminuted displaced fracture of the right mandibular condyle. 4. Hairline fracture of the left mandibular body just posterior to the last remaining tooth in the left mandibular body. 5. Chronic opacification of the mastoid air cells and middle ear cavities bilaterally. 6. 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 Electronically Signed   By: Lorriane Shire M.D.   On: 08/28/2019 19:49    EKG: Independently reviewed.  Narrow QRS rhythm without clear consistent P waves, substantial artifact.  No overt ischemic changes.  Assessment/Plan Principal Problem:   Fall Active Problems:   Rib fracture   Multiple closed mandibular fractures (HCC)   Alcohol dependence ([place])   Acute respiratory failure with hypoxia ([place])   Fall at home, suspect related to alcohol intoxication Patient's wife had witnessed the fall and did not report loss of consciousness or seizure activity.  Patient reports drinking liquor on a regular basis and prior to his fall.  Blood ethanol level elevated.  Infectious etiology less likely given no fever or significant leukocytosis.  Chest x-ray not suggestive of pneumonia. -Counseled on alcohol cessation -PT evaluation -UA pending to rule out UTI  Rib and mandibular fractures secondary to fall CT maxillofacial showing comminuted displaced fracture of the right mandibular condyle.  Hairline fracture of the left mandibular body just posterior to the last remaining tooth in the left mandibular body. Chest x-ray showing acute appearing nondisplaced fracture of the left anterolateral ninth rib.  No  pneumothorax.  Patient not complaining of any pain and appears comfortable. -Tylenol as needed, tramadol as needed -Soft diet given mandibular fractures.  Consider ENT consultation in a.m.  Alcohol dependence -CIWA protocol; Ativan as needed -Thiamine, folate, multivitamin  Acute hypoxic respiratory failure secondary to COPD exacerbation Oxygen saturation down to 83 to 86% on room air and improved to 90 to 92%.  RN reported additional hypoxia with oxygen saturation going down to 76%.  Currently satting well on 2 L supplemental oxygen via nasal cannula.  Wheezing on exam.  Chest x-ray without acute cardiopulmonary disease.  SARS-CoV-2 test negative. -IV Solu-Medrol 125 mg, then prednisone 40 mg daily starting in the morning -DuoNebs every 6 hours -Albuterol nebulizer as needed -Azithromycin -Continuous pulse ox -Supplemental oxygen  Mild thrombocytopenia Platelet count 145,000, no prior labs for comparison.   -Continue to monitor platelet count  Hypokalemia Potassium 3.0.  Magnesium 1.5. -Replete potassium and magnesium.  Continue to monitor electrolytes.  DVT prophylaxis: SCDs at this time Code Status: Patient wishes to  be full code. Family Communication: No family available. Disposition Plan: Anticipate discharge in 1 to 2 days. Consults called: None Admission status: It is my clinical opinion that referral for OBSERVATION is reasonable and necessary in this patient based on the above information provided. The aforementioned taken together are felt to place the patient at high risk for further clinical deterioration. However it is anticipated that the patient may be medically stable for discharge from the hospital within 24 to 48 hours.  The medical decision making on this patient was of high complexity and the patient is at high risk for clinical deterioration, therefore this is a level 3 visit.  John Giovanni MD [facility] Pager [phone]  If 7PM-7AM, please  contact night-coverage [email or web address] Password [email or web address]  08/29/2019, 7:10 AM

## 2019-08-28 NOTE — ED Triage Notes (Addendum)
Pt presents from home via EMS for fall. Pt and EMS unsure of detail of fall, pt states "I don't remember falling", has had 3 oz of EtOH (drinks 3oz daily). Denies LOC  EMS exam - 1in lac to chin (possibly from dresser), blood noted in R ear, tongue laceration , neg stroke score, pupils 47mm without known opiate use.  H/o COPD, required 6L NRB to increase spo2 to 99% from 92%, worse while lying flat.  Lives with wife and daughter, will arrive shortly and may be able to provide more details

## 2019-08-28 NOTE — ED Provider Notes (Addendum)
[facility] EMERGENCY DEPARTMENT Provider Note   CSN: [id number] Arrival date & time: 08/28/19  1824     History   Chief Complaint Chief Complaint  Patient presents with   Fall    HPI Timothy Bowman is a 73 y.o. male with past medical history of GERD, hypertension, MI brought in by EMS for evaluation of fall.  Both patient and EMS are unsure of details of fall.  Patient states that he does not recall falling.  He states that the last thing he remembers was being at home and then the EMS people were coming in and getting him off the bed.  He states that he does not know what happened.  Patient states that he does not have any pain.  He does not remember falling today but states that he fell a few days ago.  He states that he tripped over a pipe after he had been drinking some alcohol.  He does report that he is a daily drinker and drinks about 3 ounces of liquor a day.  He states he is only had 2 ounces of liquor today.  He does not know of any seizure history.  He states he has never gone into seizures after withdrawals.  Patient denies any chest pain, difficulty breathing, abdominal pain, nausea/vomiting, numbness/weakness of his arms or legs.  He does not know when his last tetanus shot was.     The history is provided by the patient.    Past Medical History:  Diagnosis Date   GERD (gastroesophageal reflux disease)    Hypertension    Myocardial infarction 1988   x 1 sees dr Merk yearly   Shortness of breath    with exertion    Patient Active Problem List   Diagnosis Date Noted   Fall 08/28/2019    Past Surgical History:  Procedure Laterality Date   BALLOON DILATION N/A 04/02/2014   Procedure: BALLOON DILATION;  Surgeon: Charolett Bumpers, MD;  Location: [facility] ENDOSCOPY;  Service: Endoscopy;  Laterality: N/A;   CORONARY ANGIOPLASTY  1988   ESOPHAGOGASTRODUODENOSCOPY N/A 04/02/2014   Procedure: ESOPHAGOGASTRODUODENOSCOPY (EGD) with balloon  dilation;  Surgeon: Charolett Bumpers, MD;  Location: [facility] ENDOSCOPY;  Service: Endoscopy;  Laterality: N/A;   HERNIA REPAIR  yrs ago   surgery on collarbone for fracture  yrs ago        Home Medications    Prior to Admission medications   Medication Sig Start Date End Date Taking? Authorizing Provider  albuterol (PROVENTIL HFA;VENTOLIN HFA) 108 (90 BASE) MCG/ACT inhaler Inhale 1 puff into the lungs every 6 (six) hours as needed for wheezing or shortness of breath.   Yes [provider]  amLODipine (NORVASC) 10 MG tablet Take 10 mg by mouth every morning.   Yes [provider]  aspirin EC 81 MG tablet Take 81 mg by mouth daily.   Yes [provider]  benazepril (LOTENSIN) 40 MG tablet Take 40 mg by mouth every morning.   Yes [provider]  cholecalciferol (VITAMIN D) 1000 UNITS tablet Take 1,000 Units by mouth daily.   Yes [provider]  omega-3 acid ethyl esters (LOVAZA) 1 G capsule Take 1 g by mouth daily.   Yes [provider]  simvastatin (ZOCOR) 40 MG tablet Take 40 mg by mouth daily.   Yes [provider]  valsartan (DIOVAN) 160 MG tablet Take 160 mg by mouth every morning.   Yes [provider]  Family History No family history on file.  Social History Social History   Tobacco Use   Smoking status: Current Every Day Smoker    Packs/day: 0.25    Years: 40.00    Pack years: 10.00    Types: Cigarettes   Smokeless tobacco: Never Used  Substance Use Topics   Alcohol use: Yes    Comment: drinks twice per month   Drug use: No     Allergies   Patient has no known allergies.   Review of Systems Review of Systems  Constitutional: Negative for fever.  Respiratory: Negative for shortness of breath.   Cardiovascular: Negative for chest pain.  Gastrointestinal: Negative for abdominal pain, nausea and vomiting.  Genitourinary: Negative for dysuria and hematuria.  Skin: Positive for wound.   Neurological: Negative for weakness, numbness and headaches.  All other systems reviewed and are negative.    Physical Exam Updated Vital Signs BP 96/66    Pulse 83    Temp 98.1 F (36.7 C) (Oral)    Resp (!) 21    SpO2 96%   Physical Exam Vitals signs and nursing note reviewed.  Constitutional:      Appearance: Normal appearance. He is well-developed.  HENT:     Head: Normocephalic and atraumatic.     Comments: No tenderness to palpation of skull. No deformities or crepitus noted. No open wounds, abrasions or lacerations.  Old abrasions noted to forehead.  2 centimeter laceration noted to chin.    Left Ear: Tympanic membrane normal.     Ears:     Comments: Left TM within normal limits.  Unable to visualize right TM secondary to blood noted in the right external auditory canal.    Mouth/Throat:     Comments: Laceration noted to tongue. Eyes:     General: Lids are normal.     Conjunctiva/sclera: Conjunctivae normal.     Pupils: Pupils are equal, round, and reactive to light.     Comments: PERRL. EOMs intact. No nystagmus. No neglect.   Neck:     Musculoskeletal: No spinous process tenderness.     Comments: C-collar in place. Cardiovascular:     Rate and Rhythm: Normal rate and regular rhythm.     Pulses: Normal pulses.          Radial pulses are 2+ on the right side and 2+ on the left side.     Heart sounds: Normal heart sounds. No murmur. No friction rub. No gallop.   Pulmonary:     Effort: Pulmonary effort is normal.     Breath sounds: Wheezing present.     Comments: Diffuse wheezing noted throughout. Chest:     Comments: No tenderness palpation noted anterior chest wall.  No deformity or crepitus noted. Abdominal:     Palpations: Abdomen is soft. Abdomen is not rigid.     Tenderness: There is no abdominal tenderness. There is no guarding.     Comments: Abdomen is soft, non-distended, non-tender. No rigidity, No guarding. No peritoneal signs.  Musculoskeletal: Normal  range of motion.     Thoracic back: He exhibits no tenderness.     Lumbar back: He exhibits no tenderness.     Comments: No tenderness to palpation to bilateral shoulders, clavicles, elbows, and wrists. No deformities or crepitus noted. FROM of BUE without difficulty.  No tenderness to palpation to bilateral knees and ankles. No deformities or crepitus noted. FROM of BLE without any difficulty.  No pelvic instability noted. No midline T or  L spine tenderness. No deformity or crepitus noted.   Skin:    General: Skin is warm and dry.     Capillary Refill: Capillary refill takes less than 2 seconds.  Neurological:     Mental Status: He is alert and oriented to person, place, and time.     Comments: Cranial nerves III-XII intact Follows commands, Moves all extremities  5/5 strength to BUE and BLE  Sensation intact throughout all major nerve distributions Normal coordination  No slurred speech. No facial droop.   Psychiatric:        Speech: Speech normal.      ED Treatments / Results  Labs (all labs ordered are listed, but only abnormal results are displayed) Labs Reviewed  COMPREHENSIVE METABOLIC PANEL - Abnormal; Notable for the following components:      Result Value   Potassium 3.0 (*)    CO2 21 (*)    Glucose, Bld 106 (*)    Albumin 3.3 (*)    AST 55 (*)    All other components within normal limits  CBC WITH DIFFERENTIAL/PLATELET - Abnormal; Notable for the following components:   WBC 12.7 (*)    Platelets 145 (*)    Neutro Abs 10.5 (*)    All other components within normal limits  ETHANOL - Abnormal; Notable for the following components:   Alcohol, Ethyl (B) 145 (*)    All other components within normal limits  SARS CORONAVIRUS 2 (TAT 6-24 HRS)  URINALYSIS, ROUTINE W REFLEX MICROSCOPIC    EKG EKG Interpretation  Date/Time:  Tuesday August 28 2019 18:47:02 EDT Ventricular Rate:  96 PR Interval:    QRS Duration: 180 QT Interval:  365 QTC Calculation: 411 R  Axis:   -14 Text Interpretation:  narrow qrs rhythym w/o clear consistent p waves substantial artefact, no overt ischemic changes Abnormal ECG Confirmed by Gerhard Munch [phone]) on 08/28/2019 6:53:34 PM   Radiology Dg Chest 2 View  Result Date: 08/28/2019 CLINICAL DATA:  Initial evaluation for acute pain status post recent fall. EXAM: CHEST - 2 VIEW COMPARISON:  Prior radiograph from 08/22/2006. FINDINGS: Cardiac and mediastinal silhouettes are within normal limits. Lungs normally inflated. Mild chronic coarsening of the interstitial markings. No focal infiltrates, edema, or effusion. No pneumothorax. There is an acute appearing nondisplaced fracture of the left anterolateral ninth rib. No other acute osseous abnormality. IMPRESSION: 1. Acute appearing nondisplaced fracture of the left anterolateral ninth rib. Correlation with physical exam recommended. 2. No other active cardiopulmonary disease. No pneumothorax. Electronically Signed   By: Rise Mu M.D.   On: 08/28/2019 19:43   Dg Pelvis 1-2 Views  Result Date: 08/28/2019 CLINICAL DATA:  Initial evaluation for acute pain status post recent fall. EXAM: PELVIS - 1-2 VIEW COMPARISON:  None. FINDINGS: No acute fracture dislocation. No pubic diastasis. SI joints approximated. Femoral heads in normal position within the acetabula. No visible acute soft tissue injury. Mild to moderate osteoarthritic changes about the hips bilaterally. Degenerative changes noted within the lower lumbar spine. IMPRESSION: No acute osseous abnormality about the pelvis. Electronically Signed   By: Rise Mu M.D.   On: 08/28/2019 19:46   Ct Head Wo Contrast  Result Date: 08/28/2019 CLINICAL DATA:  Head and face trauma secondary to a fall at home. Headache. EXAM: CT HEAD WITHOUT CONTRAST CT MAXILLOFACIAL WITHOUT CONTRAST CT CERVICAL SPINE WITHOUT CONTRAST TECHNIQUE: Multidetector CT imaging of the head, cervical spine, and maxillofacial structures were  performed using the standard protocol without intravenous contrast. Multiplanar  CT image reconstructions of the cervical spine and maxillofacial structures were also generated. COMPARISON:  CT scan of the head dated 08/22/2006 and CT scan of the neck dated 03/01/2014 FINDINGS: CT HEAD FINDINGS Brain: No evidence of acute infarction, hemorrhage, hydrocephalus, extra-axial collection or mass lesion/mass effect. Since the prior study of 2007 the patient has developed diffuse mild cerebral cortical atrophy with secondary ventricular dilatation. There is also new slight periventricular white matter lucency consistent with small vessel ischemic disease. Vascular: No hyperdense vessel or unexpected calcification. Skull: Normal. Negative for fracture or focal lesion. Other: Chronic opacification of the mastoid air cells with partial opacification of the middle ear cavities, chronic. The patient has developed mucosal thickening in the bases of both maxillary sinuses since the prior study. Orbits appear normal. CT MAXILLOFACIAL FINDINGS Osseous: There is a comminuted displaced fracture of the right mandibular condyle. There is also a hairline fracture through the left mandibular body just posterior to the last remaining tooth in the left mandibular body. Orbits: Old blowout fracture of floor of the left orbit. Old slight deformity of the left zygomatic arch consistent with an old fracture. Sinuses: Slight mucosal thickening in the maxillary sinuses. Chronic opacification of the mastoid air cells and chronic slight partial opacification of the middle ear cavities. Soft tissues: Negative. CT CERVICAL SPINE FINDINGS Alignment: Normal. Skull base and vertebrae: No acute fracture. No primary bone lesion or focal pathologic process. Soft tissues and spinal canal: No prevertebral fluid or swelling. No visible canal hematoma. Disc levels: C2-3: Slight bilateral facet arthritis. No disc bulging or protrusion. C3-4: Anterior  osteophytes fuse the C3-4 level. No disc bulging or protrusion. Foraminal or spinal stenosis. C4-5: Marked disc space narrowing. Broad-based disc osteophyte complex slightly narrows the right lateral recess. No significant foraminal stenosis. Central disc bulge which compresses the ventral aspect of the spinal cord. AP dimension of the cord at that level is approximately 6 mm. C5-6: Disc space narrowing. Central disc protrusion with prominent osteophytes to the right and left. The spinal cord is compressed to a minimum dimension of approximately 5 mm. Bilateral lateral recess stenosis and moderately severe bilateral foraminal stenosis. C6-7: Broad-based disc osteophyte complex without focal neural impingement. C7-T1: Tiny central disc bulge with no neural impingement. T1-2: Negative. T2-3: Negative. Upper chest: Emphysema. Other: None IMPRESSION: 1. No acute intracranial abnormality. 2. No acute abnormality of the cervical spine. 3. Comminuted displaced fracture of the right mandibular condyle. 4. Hairline fracture of the left mandibular body just posterior to the last remaining tooth in the left mandibular body. 5. Chronic opacification of the mastoid air cells and middle ear cavities bilaterally. 6. 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 Electronically Signed   By: Francene Boyers M.D.   On: 08/28/2019 19:49   Ct Cervical Spine Wo Contrast  Result Date: 08/28/2019 CLINICAL DATA:  Head and face trauma secondary to a fall at home. Headache. EXAM: CT HEAD WITHOUT CONTRAST CT MAXILLOFACIAL WITHOUT CONTRAST CT CERVICAL SPINE WITHOUT CONTRAST TECHNIQUE: Multidetector CT imaging of the head, cervical spine, and maxillofacial structures were performed using the standard protocol without intravenous contrast. Multiplanar CT image reconstructions of the cervical spine and maxillofacial structures were also generated. COMPARISON:  CT scan of the head  dated 08/22/2006 and CT scan of the neck dated 03/01/2014 FINDINGS: CT HEAD FINDINGS Brain: No evidence of acute infarction, hemorrhage, hydrocephalus, extra-axial collection or mass lesion/mass effect. Since the prior study of 2007 the patient  has developed diffuse mild cerebral cortical atrophy with secondary ventricular dilatation. There is also new slight periventricular white matter lucency consistent with small vessel ischemic disease. Vascular: No hyperdense vessel or unexpected calcification. Skull: Normal. Negative for fracture or focal lesion. Other: Chronic opacification of the mastoid air cells with partial opacification of the middle ear cavities, chronic. The patient has developed mucosal thickening in the bases of both maxillary sinuses since the prior study. Orbits appear normal. CT MAXILLOFACIAL FINDINGS Osseous: There is a comminuted displaced fracture of the right mandibular condyle. There is also a hairline fracture through the left mandibular body just posterior to the last remaining tooth in the left mandibular body. Orbits: Old blowout fracture of floor of the left orbit. Old slight deformity of the left zygomatic arch consistent with an old fracture. Sinuses: Slight mucosal thickening in the maxillary sinuses. Chronic opacification of the mastoid air cells and chronic slight partial opacification of the middle ear cavities. Soft tissues: Negative. CT CERVICAL SPINE FINDINGS Alignment: Normal. Skull base and vertebrae: No acute fracture. No primary bone lesion or focal pathologic process. Soft tissues and spinal canal: No prevertebral fluid or swelling. No visible canal hematoma. Disc levels: C2-3: Slight bilateral facet arthritis. No disc bulging or protrusion. C3-4: Anterior osteophytes fuse the C3-4 level. No disc bulging or protrusion. Foraminal or spinal stenosis. C4-5: Marked disc space narrowing. Broad-based disc osteophyte complex slightly narrows the right lateral recess. No  significant foraminal stenosis. Central disc bulge which compresses the ventral aspect of the spinal cord. AP dimension of the cord at that level is approximately 6 mm. C5-6: Disc space narrowing. Central disc protrusion with prominent osteophytes to the right and left. The spinal cord is compressed to a minimum dimension of approximately 5 mm. Bilateral lateral recess stenosis and moderately severe bilateral foraminal stenosis. C6-7: Broad-based disc osteophyte complex without focal neural impingement. C7-T1: Tiny central disc bulge with no neural impingement. T1-2: Negative. T2-3: Negative. Upper chest: Emphysema. Other: None IMPRESSION: 1. No acute intracranial abnormality. 2. No acute abnormality of the cervical spine. 3. Comminuted displaced fracture of the right mandibular condyle. 4. Hairline fracture of the left mandibular body just posterior to the last remaining tooth in the left mandibular body. 5. Chronic opacification of the mastoid air cells and middle ear cavities bilaterally. 6. 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 Electronically Signed   By: Francene Boyers M.D.   On: 08/28/2019 19:49   Ct Maxillofacial Wo Contrast  Result Date: 08/28/2019 CLINICAL DATA:  Head and face trauma secondary to a fall at home. Headache. EXAM: CT HEAD WITHOUT CONTRAST CT MAXILLOFACIAL WITHOUT CONTRAST CT CERVICAL SPINE WITHOUT CONTRAST TECHNIQUE: Multidetector CT imaging of the head, cervical spine, and maxillofacial structures were performed using the standard protocol without intravenous contrast. Multiplanar CT image reconstructions of the cervical spine and maxillofacial structures were also generated. COMPARISON:  CT scan of the head dated 08/22/2006 and CT scan of the neck dated 03/01/2014 FINDINGS: CT HEAD FINDINGS Brain: No evidence of acute infarction, hemorrhage, hydrocephalus, extra-axial collection or mass lesion/mass effect. Since the  prior study of 2007 the patient has developed diffuse mild cerebral cortical atrophy with secondary ventricular dilatation. There is also new slight periventricular white matter lucency consistent with small vessel ischemic disease. Vascular: No hyperdense vessel or unexpected calcification. Skull: Normal. Negative for fracture or focal lesion. Other: Chronic opacification of the mastoid air cells with partial opacification of the middle ear cavities,  chronic. The patient has developed mucosal thickening in the bases of both maxillary sinuses since the prior study. Orbits appear normal. CT MAXILLOFACIAL FINDINGS Osseous: There is a comminuted displaced fracture of the right mandibular condyle. There is also a hairline fracture through the left mandibular body just posterior to the last remaining tooth in the left mandibular body. Orbits: Old blowout fracture of floor of the left orbit. Old slight deformity of the left zygomatic arch consistent with an old fracture. Sinuses: Slight mucosal thickening in the maxillary sinuses. Chronic opacification of the mastoid air cells and chronic slight partial opacification of the middle ear cavities. Soft tissues: Negative. CT CERVICAL SPINE FINDINGS Alignment: Normal. Skull base and vertebrae: No acute fracture. No primary bone lesion or focal pathologic process. Soft tissues and spinal canal: No prevertebral fluid or swelling. No visible canal hematoma. Disc levels: C2-3: Slight bilateral facet arthritis. No disc bulging or protrusion. C3-4: Anterior osteophytes fuse the C3-4 level. No disc bulging or protrusion. Foraminal or spinal stenosis. C4-5: Marked disc space narrowing. Broad-based disc osteophyte complex slightly narrows the right lateral recess. No significant foraminal stenosis. Central disc bulge which compresses the ventral aspect of the spinal cord. AP dimension of the cord at that level is approximately 6 mm. C5-6: Disc space narrowing. Central disc protrusion  with prominent osteophytes to the right and left. The spinal cord is compressed to a minimum dimension of approximately 5 mm. Bilateral lateral recess stenosis and moderately severe bilateral foraminal stenosis. C6-7: Broad-based disc osteophyte complex without focal neural impingement. C7-T1: Tiny central disc bulge with no neural impingement. T1-2: Negative. T2-3: Negative. Upper chest: Emphysema. Other: None IMPRESSION: 1. No acute intracranial abnormality. 2. No acute abnormality of the cervical spine. 3. Comminuted displaced fracture of the right mandibular condyle. 4. Hairline fracture of the left mandibular body just posterior to the last remaining tooth in the left mandibular body. 5. Chronic opacification of the mastoid air cells and middle ear cavities bilaterally. 6. 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 Electronically Signed   By: Lorriane Shire M.D.   On: 08/28/2019 19:49    Procedures .Marland KitchenLaceration Repair  Date/Time: 08/28/2019 8:31 PM Performed by: Volanda Napoleon, PA-C Authorized by: Volanda Napoleon, PA-C   Consent:    Consent obtained:  Verbal   Consent given by:  Patient   Risks discussed:  Infection, need for additional repair, pain, poor cosmetic result and poor wound healing   Alternatives discussed:  No treatment and delayed treatment Universal protocol:    Procedure explained and questions answered to patient or proxy's satisfaction: yes     Relevant documents present and verified: yes     Test results available and properly labeled: yes     Imaging studies available: yes     Required blood products, implants, devices, and special equipment available: yes     Site/side marked: yes     Immediately prior to procedure, a time out was called: yes     Patient identity confirmed:  Verbally with patient Anesthesia (see MAR for exact dosages):    Anesthesia method:  Local infiltration   Local anesthetic:   Lidocaine 2% WITH epi Laceration details:    Location:  Face   Face location:  Chin   Length (cm):  2 Repair type:    Repair type:  Intermediate Pre-procedure details:    Preparation:  Patient was prepped and draped in usual sterile fashion Exploration:  Hemostasis achieved with:  Direct pressure   Wound exploration: wound explored through full range of motion     Wound extent: no foreign bodies/material noted   Treatment:    Area cleansed with:  Betadine   Amount of cleaning:  Extensive   Irrigation solution:  Sterile saline   Irrigation method:  Syringe Subcutaneous repair:    Suture size:  5-0   Suture material:  Vicryl   Suture technique:  Simple interrupted   Number of sutures:  2 Skin repair:    Repair method:  Sutures   Suture size:  6-0   Suture material:  Prolene   Suture technique:  Simple interrupted   Number of sutures:  6 Post-procedure details:    Dressing:  Antibiotic ointment   Patient tolerance of procedure:  Tolerated well, no immediate complications    2 vicryl  6 prolene   (including critical care time)  Medications Ordered in ED Medications  LORazepam (ATIVAN) injection 0-4 mg (has no administration in time range)    Or  LORazepam (ATIVAN) tablet 0-4 mg (has no administration in time range)  LORazepam (ATIVAN) injection 0-4 mg (has no administration in time range)    Or  LORazepam (ATIVAN) tablet 0-4 mg (has no administration in time range)  thiamine (VITAMIN B-1) tablet 100 mg (has no administration in time range)    Or  thiamine (B-1) injection 100 mg (has no administration in time range)  Tdap (BOOSTRIX) injection 0.5 mL (0.5 mLs Intramuscular Given 08/28/19 1851)  sodium chloride 0.9 % bolus 1,000 mL (0 mLs Intravenous Stopped 08/28/19 2055)  lidocaine-EPINEPHrine (XYLOCAINE W/EPI) 2 %-1:200000 (PF) injection 10 mL (10 mLs Intradermal Given 08/28/19 1942)  fentaNYL (SUBLIMAZE) injection 50 mcg (50 mcg Intravenous Given 08/28/19 2043)   ondansetron (ZOFRAN) injection 4 mg (4 mg Intravenous Given 08/28/19 2043)  albuterol (VENTOLIN HFA) 108 (90 Base) MCG/ACT inhaler 4 puff (4 puffs Inhalation Given 08/28/19 2043)  bacitracin ointment ( Topical Given 08/28/19 2043)  chlorhexidine (PERIDEX) 0.12 % solution 5 mL (5 mLs Mouth/Throat Given 08/28/19 2138)     Initial Impression / Assessment and Plan / ED Course  I have reviewed the triage vital signs and the nursing notes.  Pertinent labs & imaging results that were available during my care of the patient were reviewed by me and considered in my medical decision making (see chart for details).         73 year old male who is brought in by EMS for evaluation of unknown fall.  Patient states he does not remember falling.  Denies any seizure history.  Does endorse daily alcohol use of about 3 ounces.  Reports 2 ounces of liquor at about 10 AM this morning.  Denies any pain at this time.  He has abrasions noted to head as well as a laceration to his chin.  Unable to visualize right TM secondary to blood in right ear.  No chest deformity or crepitus noted.  No pelvic instability.  He is alert and oriented x3 and has no neuro deficits on exam.  Question if this was seizure versus mechanical fall versus alcohol intoxication.  We will plan to check labs, imaging.  I discussed with wife who arrived at the ED.  She states that patient fell down approximately 13 steps.  She reports that she was at home when this occurred.  She states that he was at the top of the steps and fell and when he got to the bottom of steps, he hit  a cabinet and thinks that is what caused the laceration to his chin and tongue.  She states that she did witness the fall and did not note any seizure activity.  She states patient did not have any LOC.  He is not on blood thinners.  She states that patient was at his normal baseline.  She thinks he fell as a combination of his drinking and losing balance.  CBC shows  leukocytosis 12.7.  Hemoglobin stable.  CMP shows potassium of 3.0, bicarb 21, BUN and creatinine within normal limits.  CT head shows no acute intracranial normality.  No acute C-spine injury.  He has a hairline fracture of the left mandible body just posterior to last remaining tooth on the left mandible body.  Nose has a comminuted displaced fracture of the right mandibular condyle. Chest x-ray shows ninth rib fracture.  Pelvic x-ray stable.  As there is repairing his laceration, he did desat down when he changed positions of been moved down to the 80s.  He was borderline between 83% - 86% and then would come back up to about 90-92%.  RN had additional hypoxia with him going down to 76%.  He does have significant wheezing noted on exam.  He does not have any oxygen at home.  Given rib fractures, hypoxia, extensive trauma, feel that admission for observation overnight would be best.  Discussed patient with Dr. Loney Loh (hospitalist). She looks at patient for admission.  Given his alcohol use, will put him on CIWA protocol.  Portions of this note were generated with Scientist, clinical (histocompatibility and immunogenetics). Dictation errors may occur despite best attempts at proofreading.   Final Clinical Impressions(s) / ED Diagnoses   Final diagnoses:  Fall, initial encounter  Chin laceration, initial encounter  Closed fracture of one rib, unspecified laterality, initial encounter  Closed fracture of right side of mandibular body, initial encounter [facility])    ED Discharge Orders    None       Maxwell Caul, PA-C 08/28/19 2152    Maxwell Caul, PA-C 08/28/19 2153    Gerhard Munch, MD 08/28/19 2330

## 2019-08-29 ENCOUNTER — Encounter (HOSPITAL_COMMUNITY): Payer: Self-pay | Admitting: Internal Medicine

## 2019-08-29 DIAGNOSIS — I252 Old myocardial infarction: Secondary | ICD-10-CM | POA: Diagnosis not present

## 2019-08-29 DIAGNOSIS — J9601 Acute respiratory failure with hypoxia: Secondary | ICD-10-CM | POA: Diagnosis present

## 2019-08-29 DIAGNOSIS — K219 Gastro-esophageal reflux disease without esophagitis: Secondary | ICD-10-CM | POA: Diagnosis not present

## 2019-08-29 DIAGNOSIS — Z720 Tobacco use: Secondary | ICD-10-CM | POA: Diagnosis present

## 2019-08-29 DIAGNOSIS — I10 Essential (primary) hypertension: Secondary | ICD-10-CM | POA: Diagnosis not present

## 2019-08-29 DIAGNOSIS — F1020 Alcohol dependence, uncomplicated: Secondary | ICD-10-CM | POA: Diagnosis present

## 2019-08-29 DIAGNOSIS — S0181XA Laceration without foreign body of other part of head, initial encounter: Secondary | ICD-10-CM | POA: Diagnosis not present

## 2019-08-29 DIAGNOSIS — J9612 Chronic respiratory failure with hypercapnia: Secondary | ICD-10-CM | POA: Diagnosis not present

## 2019-08-29 DIAGNOSIS — Z20828 Contact with and (suspected) exposure to other viral communicable diseases: Secondary | ICD-10-CM | POA: Diagnosis not present

## 2019-08-29 DIAGNOSIS — S02609A Fracture of mandible, unspecified, initial encounter for closed fracture: Secondary | ICD-10-CM | POA: Diagnosis present

## 2019-08-29 DIAGNOSIS — S2232XA Fracture of one rib, left side, initial encounter for closed fracture: Secondary | ICD-10-CM | POA: Diagnosis not present

## 2019-08-29 DIAGNOSIS — Z7982 Long term (current) use of aspirin: Secondary | ICD-10-CM | POA: Diagnosis not present

## 2019-08-29 DIAGNOSIS — S2239XA Fracture of one rib, unspecified side, initial encounter for closed fracture: Secondary | ICD-10-CM | POA: Diagnosis present

## 2019-08-29 DIAGNOSIS — F1010 Alcohol abuse, uncomplicated: Secondary | ICD-10-CM | POA: Diagnosis present

## 2019-08-29 DIAGNOSIS — S2221XA Fracture of manubrium, initial encounter for closed fracture: Secondary | ICD-10-CM | POA: Diagnosis not present

## 2019-08-29 DIAGNOSIS — Z23 Encounter for immunization: Secondary | ICD-10-CM | POA: Diagnosis not present

## 2019-08-29 LAB — CBC
HCT: 41.9 % (ref 39.0–52.0)
Hemoglobin: 14.6 g/dL (ref 13.0–17.0)
MCH: 31.4 pg (ref 26.0–34.0)
MCHC: 34.8 g/dL (ref 30.0–36.0)
MCV: 90.1 fL (ref 80.0–100.0)
Platelets: 117 10*3/uL — ABNORMAL LOW (ref 150–400)
RBC: 4.65 MIL/uL (ref 4.22–5.81)
RDW: 14 % (ref 11.5–15.5)
WBC: 9.2 10*3/uL (ref 4.0–10.5)
nRBC: 0 % (ref 0.0–0.2)

## 2019-08-29 LAB — BASIC METABOLIC PANEL
Anion gap: 11 (ref 5–15)
BUN: 24 mg/dL — ABNORMAL HIGH (ref 8–23)
CO2: 23 mmol/L (ref 22–32)
Calcium: 9.4 mg/dL (ref 8.9–10.3)
Chloride: 107 mmol/L (ref 98–111)
Creatinine, Ser: 1.17 mg/dL (ref 0.61–1.24)
GFR calc Af Amer: >60 mL/min (ref >60)
GFR calc non Af Amer: >60 mL/min (ref >60)
Glucose, Bld: 130 mg/dL — ABNORMAL HIGH (ref 70–99)
Potassium: 3.7 mmol/L (ref 3.5–5.1)
Sodium: 141 mmol/L (ref 135–145)

## 2019-08-29 LAB — SARS CORONAVIRUS 2 (TAT 6-24 HRS): SARS Coronavirus 2: NEGATIVE

## 2019-08-29 LAB — MAGNESIUM: Magnesium: 1.5 mg/dL — ABNORMAL LOW (ref 1.7–2.4)

## 2019-08-29 MED ORDER — CHLORHEXIDINE GLUCONATE 0.12 % MT SOLN
15.0000 mL | Freq: Four times a day (QID) | OROMUCOSAL | Status: DC
  Administered 2019-08-29: 15 mL via OROMUCOSAL
  Filled 2019-08-29 (×3): qty 15

## 2019-08-29 MED ORDER — MAGNESIUM SULFATE 2 GM/50ML IV SOLN
2.0000 g | Freq: Once | INTRAVENOUS | Status: AC
  Administered 2019-08-29: 2 g via INTRAVENOUS
  Filled 2019-08-29: qty 50

## 2019-08-29 MED ORDER — AMOXICILLIN 500 MG PO CAPS
500.0000 mg | ORAL_CAPSULE | Freq: Three times a day (TID) | ORAL | Status: DC
  Administered 2019-08-29: 500 mg via ORAL
  Filled 2019-08-29: qty 1

## 2019-08-29 MED ORDER — CHLORHEXIDINE GLUCONATE 0.12 % MT SOLN: 15.0000 mL | Freq: Four times a day (QID) | OROMUCOSAL | 0 refills | Status: AC

## 2019-08-29 MED ORDER — TRAMADOL HCL 50 MG PO TABS: 50.0000 mg | ORAL_TABLET | Freq: Four times a day (QID) | ORAL | Status: DC | PRN

## 2019-08-29 MED ORDER — ALBUTEROL SULFATE (2.5 MG/3ML) 0.083% IN NEBU: 5.0000 mg | INHALATION_SOLUTION | RESPIRATORY_TRACT | Status: DC | PRN

## 2019-08-29 MED ORDER — AMOXICILLIN 500 MG PO CAPS: 500.0000 mg | ORAL_CAPSULE | Freq: Two times a day (BID) | ORAL | 0 refills | Status: AC

## 2019-08-29 MED ORDER — IPRATROPIUM-ALBUTEROL 0.5-2.5 (3) MG/3ML IN SOLN: 3.0000 mL | Freq: Four times a day (QID) | RESPIRATORY_TRACT | Status: DC | PRN

## 2019-08-29 MED FILL — AMOXICILLIN 500 MG CAPS: 500 | 7 days supply | Qty: 14 | Fill #0

## 2019-08-29 MED FILL — CHLORHEXIDINE 0.12% RINSE: 0.12 | 10 days supply | Qty: 473 | Fill #0

## 2019-08-29 NOTE — Discharge Instructions (Signed)
Mandibular Fracture  A mandibular fracture is a break in the jawbone. What are the causes? The most common cause of this injury is a direct blow (trauma) to the jaw. This can happen from:  A car crash.  Physical violence.  A fall from a high place. What are the signs or symptoms? Symptoms of this injury include:  Pain.  Swelling.  Difficulty closing the mouth.  Feeling that your teeth are not aligned properly when you close your mouth (malocclusion).  Difficulty speaking.  Difficulty swallowing. How is this diagnosed? This injury may be diagnosed with a medical history and physical exam. Your health care provider may order imaging tests, such as an X-ray or CT scan. How is this treated? This injury may be treated with:  Rest. For mild fractures, resting the jaw may be the only treatment needed.  Surgery to put the jaw back in the right position. During surgery, wires are usually placed around the teeth to hold the jaw in place while it heals.  Taking medicine for pain.  Applying ice to the jaw. This can help with pain and swelling.  Eating a soft or liquid diet. Follow these instructions at home:  If directed, apply ice to the injured area: ? Put ice in a plastic bag. ? Place a towel between your skin and the bag. ? Leave the ice on for 20 minutes, 2-3 times per day.  Take over-the-counter and prescription medicines only as told by your health care provider.  Eat a well-balanced, high-protein soft or liquid diet as told by your health care provider.  Sleep on your back to avoid putting pressure on your jaw.  Avoid exercising to the point that you become short of breath. Contact a health care provider if:  You have a severe headache.  Parts of your face feel numb.  You have severe jaw pain that is not relieved with medicine.  You have uncontrollable nausea or anxiety.  Your swelling or redness gets worse. Get help right away if:  You have a  fever.  You have difficulty breathing.  You feel like your airway is tightening.  You cannot swallow your saliva.  You make a high-pitched whistling sound when you breathe (wheezing). This information is not intended to replace advice given to you by your health care provider. Make sure you discuss any questions you have with your health care provider. Document Released: 10/25/2005 Document Revised: 11/27/2015 Document Reviewed: 07/20/2015 Elsevier Patient Education  2020 [employer]     Jaw Fracture Eating Plan A break (fracture) of the jaw bone often needs surgery for treatment. After surgery, you will need to eat foods that can be blended so that they can be sipped from a straw or given through a syringe. Work with a diet and nutrition specialist (dietitian) to create an eating plan that helps you get the nutrients you need in order to heal and stay healthy. What are tips for following this plan? General guidelines  All foods in this plan must be blended. Avoid nuts, seeds, skins, peels, bones, or any foods that cannot be blended to the right consistency.  Ask your health care provider about taking a liquid multivitamin to make sure that you get all the vitamins and minerals you need. Cooking   Before blending, remove any skins, seeds, or peels from food.  Cook meats and vegetables until tender.  Cut foods into small pieces and mix with a small amount of liquid in a food processor or  blender. Continue to add liquid until the food becomes thin enough to sip through a straw.  Add liquids such as juice, milk, cream, broth, gravy, or vegetable juice to help add flavor to foods.  Heat foods after they have been blended, not before. This reduces the amount of foam created from blending.  If you need to increase calories in food: ? Add protein powder or powdered milk to foods. ? Cook with fats, such as margarine (without trans fat), sour cream, cream cheese, cream, or nut  butters. ? Prepare foods with sweeteners, such as honey, ice cream, blackstrap molasses, or sugar. Meal planning  Eat at least three meals and three snacks daily. It is important to make sure that you get enough calories and protein to prevent weight loss and help your body heal, especially after surgery.  Eat a variety of foods from each food group every day, including fruits and vegetables, protein, whole grains, dairy, and healthy fats.  If your teeth and mouth are sensitive to extreme temperatures, heat or cool your foods to lukewarm temperatures. What foods are recommended? The items listed may not be a complete list. Talk with your dietitian about what dietary choices are best for you. Grains Hot cereals, such as oatmeal, grits, ground wheat cereals, and polenta. Rice and pasta. Couscous. Vegetables All cooked or canned vegetables, without seeds and skins. Vegetable juices. Cooked potatoes, without skins. Fruits Any cooked or canned fruits, without seeds and skins. Fresh, peeled soft fruits, such as bananas and peaches, that can be blended until smooth. All fruit juices, without seeds and skins. Meat and other protein foods Soft-boiled eggs, scrambled eggs, powdered eggs, pasteurized egg mixtures, and custard. Ground meats, such as hamburger, [place], sausage, and meatloaf. Tender, well-cooked meat, poultry, and fish, prepared without bones or skin. Soft soy foods, such as tofu. Smooth nut butters. Liquid egg substitutes. Dairy Milk. Cheese. Yogurt. Cottage cheese. Pudding. Beverages Coffee (regular or decaffeinated), tea, and mineral water. Liquid supplements that have protein and calories. Fats and oils Any oils. Melted margarine or butter. Ghee. Sour cream. Cream cheese. Avocado. Seasoning and other foods All seasonings and condiments that blend well. Ground spices. Finely ground seeds and nuts. Mustard or any smooth condiment. Summary  Foods in this plan need to be prepared so  that they can be sipped from a straw or given through a syringe. Try to have at least three meals and three snacks daily.  Avoid nuts, seeds, skins, peels, bones, or any foods that cannot be blended to the right consistency. Make sure you eat a variety of foods from each food group every day.  Include a liquid multivitamin in your plan as told by your health care provider or dietitian. This information is not intended to replace advice given to you by your health care provider. Make sure you discuss any questions you have with your health care provider. Document Released: 04/14/2010 Document Revised: 02/16/2019 Document Reviewed: 02/01/2017 Elsevier Patient Education  2020 [employer].

## 2019-08-29 NOTE — Discharge Summary (Signed)
Physician Discharge Summary  Timothy Bowman OZD:[id number] DOB: 08/10/1946 DOA: 08/28/2019  PCP: Charolette Forward, MD  Admit date: 08/28/2019 Discharge date: 08/29/2019  Time spent: 45 minutes  Recommendations for Outpatient Follow-up:  1. Follow up with Dr. Mancel Parsons in 2 days 2. No chew diet for now  Discharge Diagnoses:  Principal Problem:   Acute respiratory failure with hypoxia ([place]) Active Problems:   Rib fracture   Multiple closed mandibular fractures ([place])   Fall   Alcohol dependence ([place])   ETOH abuse   Tobacco abuse   Discharge Condition: stable  Diet recommendation: no chew diet  There were no vitals filed for this visit.  History of present illness:  Timothy Bowman is a 73 y.o. male with medical history significant of COPD, hypertension, GERD, MI presened to the hospital 10/20 via EMS for evaluation after a fall.  Patient stated he had 3 ounces of liquor and while going down steps lost balance and fell.  Denied any pain since the fall.  Denied rib or jaw pain.  Denied headache, neck pain, chest pain, abdominal pain, or pain anywhere else.  Stated he drinks liquor almost every day.  Does report having dyspnea and wheezing for the past few days.  States he has COPD and uses albuterol inhaler as needed.  Per ED provider's conversation with the patient's wife: "I discussed with wife who arrived at the ED. She states that patient fell down approximately 13 steps. She reports that she was at home when this occurred. She states that he was at the top of the steps and fell and when he got to the bottom of steps, he hit a cabinet and thinks that is what caused the laceration to his chin and tongue. She states that she did witness the fall and did not note any seizure activity. She states patient did not have any LOC. He is not on blood thinners. She states that patient was at his normal baseline. She thinks he fell as a combination of his drinking and losing  balance".   Hospital Course:  Fall at home, suspect related to alcohol intoxication Patient's wife had witnessed the fall and did not report loss of consciousness or seizure activity.  Patient reported drinking liquor on a regular basis and prior to his fall.  Blood ethanol level elevated.  Infectious etiology less likely given no fever or significant leukocytosis.  Chest x-ray not suggestive of pneumonia.  Mandibular fractures secondary to fall. CT maxillofacial showing comminuted displaced fracture of the right mandibular condyle.  Hairline fracture of the left mandibular body just posterior to the last remaining tooth in the left mandibular body. Spoke with ENT who recommended no chew diet, amoxicillan for 7 days and peridex rinse and OP follow up with him in 2 days.  Rib fracture secondary to fall. Chest x-ray showing acute appearing nondisplaced fracture of the left anterolateral ninth rib.  No pneumothorax.  Patient not complaining of any pain and appears comfortable.  Alcohol dependence. No s/sx of withdrawal this hospitalization.   Acute hypoxic respiratory failure secondary to COPD exacerbation in setting of intoxication and fx rib. Resolved at discharge.  Oxygen saturation down to 83 to 86% on room air and improved to 90 to 92%.  RN reported additional hypoxia with oxygen saturation going down to 76% while in ED. Chest x-ray without acute cardiopulmonary disease.  SARS-CoV-2 test negative. Wheezes on exam. Provided with IV Solu-Medrol 125 mg and nebs as well as azithromycin. On day of discharge  he ambulated in hall with steady gait, no increased work of breathing. Oxygen saturation level dropped briefly to 89% and quickly rebounded to above 90%. He continues to smoke. Recommend OP follow up.   Mild thrombocytopenia. Platelet count 145,000, no prior labs for comparison.  likely related to etoh use. OP follow up  Hypokalemia. Repleted and resolved at  discharge    Procedures:    Consultations:  Dr. Kenney Houseman ENT 10/21 by phone  Discharge Exam: Vitals:   08/29/19 0900 08/29/19 1237  BP: (!) 156/83 139/78  Pulse: 92 91  Resp: 18 16  Temp: 98.1 F (36.7 C) 97.9 F (36.6 C)  SpO2: 95% 97%    General: awake alert thin no acute distress Cardiovascular: rrr no mgr no LE edema Respiratory: no increased work of breathing. BS coarse with good air movement Head: no acute abnormality. Able to chew/swallow without pain. Jaw with good rom  Discharge Instructions   Discharge Instructions    Call MD for:  difficulty breathing, headache or visual disturbances   Complete by: As directed    Call MD for:  extreme fatigue   Complete by: As directed    Call MD for:  persistant dizziness or light-headedness   Complete by: As directed    Diet - low sodium heart healthy   Complete by: As directed    Discharge instructions   Complete by: As directed    NO CHEWING! Call Dr. Kenney Houseman at [phone] for appointment this week Take medications as prescribed   Increase activity slowly   Complete by: As directed      Allergies as of 08/29/2019   No Known Allergies     Medication List    TAKE these medications   albuterol 108 (90 Base) MCG/ACT inhaler Commonly known as: VENTOLIN HFA Inhale 1 puff into the lungs every 6 (six) hours as needed for wheezing or shortness of breath.   amLODipine 10 MG tablet Commonly known as: NORVASC Take 10 mg by mouth every morning.   amoxicillin 500 MG capsule Commonly known as: AMOXIL Take 1 capsule (500 mg total) by mouth 2 (two) times daily for 7 days.   aspirin EC 81 MG tablet Take 81 mg by mouth daily.   benazepril 40 MG tablet Commonly known as: LOTENSIN Take 40 mg by mouth every morning.   chlorhexidine 0.12 % solution Commonly known as: PERIDEX Use as directed 15 mLs in the mouth or throat 4 (four) times daily.   cholecalciferol 1000 units tablet Commonly known as: VITAMIN D Take  1,000 Units by mouth daily.   omega-3 acid ethyl esters 1 g capsule Commonly known as: LOVAZA Take 1 g by mouth daily.   simvastatin 40 MG tablet Commonly known as: ZOCOR Take 40 mg by mouth daily.   valsartan 160 MG tablet Commonly known as: DIOVAN Take 160 mg by mouth every morning.      No Known Allergies    The results of significant diagnostics from this hospitalization (including imaging, microbiology, ancillary and laboratory) are listed below for reference.    Significant Diagnostic Studies: Dg Chest 2 View  Result Date: 08/28/2019 CLINICAL DATA:  Initial evaluation for acute pain status post recent fall. EXAM: CHEST - 2 VIEW COMPARISON:  Prior radiograph from 08/22/2006. FINDINGS: Cardiac and mediastinal silhouettes are within normal limits. Lungs normally inflated. Mild chronic coarsening of the interstitial markings. No focal infiltrates, edema, or effusion. No pneumothorax. There is an acute appearing nondisplaced fracture of the left anterolateral ninth rib. No  other acute osseous abnormality. IMPRESSION: 1. Acute appearing nondisplaced fracture of the left anterolateral ninth rib. Correlation with physical exam recommended. 2. No other active cardiopulmonary disease. No pneumothorax. Electronically Signed   By: Rise MuBenjamin  McClintock M.D.   On: 08/28/2019 19:43   Dg Pelvis 1-2 Views  Result Date: 08/28/2019 CLINICAL DATA:  Initial evaluation for acute pain status post recent fall. EXAM: PELVIS - 1-2 VIEW COMPARISON:  None. FINDINGS: No acute fracture dislocation. No pubic diastasis. SI joints approximated. Femoral heads in normal position within the acetabula. No visible acute soft tissue injury. Mild to moderate osteoarthritic changes about the hips bilaterally. Degenerative changes noted within the lower lumbar spine. IMPRESSION: No acute osseous abnormality about the pelvis. Electronically Signed   By: Rise MuBenjamin  McClintock M.D.   On: 08/28/2019 19:46   Ct Head Wo  Contrast  Result Date: 08/28/2019 CLINICAL DATA:  Head and face trauma secondary to a fall at home. Headache. EXAM: CT HEAD WITHOUT CONTRAST CT MAXILLOFACIAL WITHOUT CONTRAST CT CERVICAL SPINE WITHOUT CONTRAST TECHNIQUE: Multidetector CT imaging of the head, cervical spine, and maxillofacial structures were performed using the standard protocol without intravenous contrast. Multiplanar CT image reconstructions of the cervical spine and maxillofacial structures were also generated. COMPARISON:  CT scan of the head dated 08/22/2006 and CT scan of the neck dated 03/01/2014 FINDINGS: CT HEAD FINDINGS Brain: No evidence of acute infarction, hemorrhage, hydrocephalus, extra-axial collection or mass lesion/mass effect. Since the prior study of 2007 the patient has developed diffuse mild cerebral cortical atrophy with secondary ventricular dilatation. There is also new slight periventricular white matter lucency consistent with small vessel ischemic disease. Vascular: No hyperdense vessel or unexpected calcification. Skull: Normal. Negative for fracture or focal lesion. Other: Chronic opacification of the mastoid air cells with partial opacification of the middle ear cavities, chronic. The patient has developed mucosal thickening in the bases of both maxillary sinuses since the prior study. Orbits appear normal. CT MAXILLOFACIAL FINDINGS Osseous: There is a comminuted displaced fracture of the right mandibular condyle. There is also a hairline fracture through the left mandibular body just posterior to the last remaining tooth in the left mandibular body. Orbits: Old blowout fracture of floor of the left orbit. Old slight deformity of the left zygomatic arch consistent with an old fracture. Sinuses: Slight mucosal thickening in the maxillary sinuses. Chronic opacification of the mastoid air cells and chronic slight partial opacification of the middle ear cavities. Soft tissues: Negative. CT CERVICAL SPINE FINDINGS  Alignment: Normal. Skull base and vertebrae: No acute fracture. No primary bone lesion or focal pathologic process. Soft tissues and spinal canal: No prevertebral fluid or swelling. No visible canal hematoma. Disc levels: C2-3: Slight bilateral facet arthritis. No disc bulging or protrusion. C3-4: Anterior osteophytes fuse the C3-4 level. No disc bulging or protrusion. Foraminal or spinal stenosis. C4-5: Marked disc space narrowing. Broad-based disc osteophyte complex slightly narrows the right lateral recess. No significant foraminal stenosis. Central disc bulge which compresses the ventral aspect of the spinal cord. AP dimension of the cord at that level is approximately 6 mm. C5-6: Disc space narrowing. Central disc protrusion with prominent osteophytes to the right and left. The spinal cord is compressed to a minimum dimension of approximately 5 mm. Bilateral lateral recess stenosis and moderately severe bilateral foraminal stenosis. C6-7: Broad-based disc osteophyte complex without focal neural impingement. C7-T1: Tiny central disc bulge with no neural impingement. T1-2: Negative. T2-3: Negative. Upper chest: Emphysema. Other: None IMPRESSION: 1. No acute intracranial abnormality. 2.  No acute abnormality of the cervical spine. 3. Comminuted displaced fracture of the right mandibular condyle. 4. Hairline fracture of the left mandibular body just posterior to the last remaining tooth in the left mandibular body. 5. Chronic opacification of the mastoid air cells and middle ear cavities bilaterally. 6. 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 Electronically Signed   By: Francene Boyers M.D.   On: 08/28/2019 19:49   Ct Cervical Spine Wo Contrast  Result Date: 08/28/2019 CLINICAL DATA:  Head and face trauma secondary to a fall at home. Headache. EXAM: CT HEAD WITHOUT CONTRAST CT MAXILLOFACIAL WITHOUT CONTRAST CT CERVICAL SPINE WITHOUT CONTRAST  TECHNIQUE: Multidetector CT imaging of the head, cervical spine, and maxillofacial structures were performed using the standard protocol without intravenous contrast. Multiplanar CT image reconstructions of the cervical spine and maxillofacial structures were also generated. COMPARISON:  CT scan of the head dated 08/22/2006 and CT scan of the neck dated 03/01/2014 FINDINGS: CT HEAD FINDINGS Brain: No evidence of acute infarction, hemorrhage, hydrocephalus, extra-axial collection or mass lesion/mass effect. Since the prior study of 2007 the patient has developed diffuse mild cerebral cortical atrophy with secondary ventricular dilatation. There is also new slight periventricular white matter lucency consistent with small vessel ischemic disease. Vascular: No hyperdense vessel or unexpected calcification. Skull: Normal. Negative for fracture or focal lesion. Other: Chronic opacification of the mastoid air cells with partial opacification of the middle ear cavities, chronic. The patient has developed mucosal thickening in the bases of both maxillary sinuses since the prior study. Orbits appear normal. CT MAXILLOFACIAL FINDINGS Osseous: There is a comminuted displaced fracture of the right mandibular condyle. There is also a hairline fracture through the left mandibular body just posterior to the last remaining tooth in the left mandibular body. Orbits: Old blowout fracture of floor of the left orbit. Old slight deformity of the left zygomatic arch consistent with an old fracture. Sinuses: Slight mucosal thickening in the maxillary sinuses. Chronic opacification of the mastoid air cells and chronic slight partial opacification of the middle ear cavities. Soft tissues: Negative. CT CERVICAL SPINE FINDINGS Alignment: Normal. Skull base and vertebrae: No acute fracture. No primary bone lesion or focal pathologic process. Soft tissues and spinal canal: No prevertebral fluid or swelling. No visible canal hematoma. Disc  levels: C2-3: Slight bilateral facet arthritis. No disc bulging or protrusion. C3-4: Anterior osteophytes fuse the C3-4 level. No disc bulging or protrusion. Foraminal or spinal stenosis. C4-5: Marked disc space narrowing. Broad-based disc osteophyte complex slightly narrows the right lateral recess. No significant foraminal stenosis. Central disc bulge which compresses the ventral aspect of the spinal cord. AP dimension of the cord at that level is approximately 6 mm. C5-6: Disc space narrowing. Central disc protrusion with prominent osteophytes to the right and left. The spinal cord is compressed to a minimum dimension of approximately 5 mm. Bilateral lateral recess stenosis and moderately severe bilateral foraminal stenosis. C6-7: Broad-based disc osteophyte complex without focal neural impingement. C7-T1: Tiny central disc bulge with no neural impingement. T1-2: Negative. T2-3: Negative. Upper chest: Emphysema. Other: None IMPRESSION: 1. No acute intracranial abnormality. 2. No acute abnormality of the cervical spine. 3. Comminuted displaced fracture of the right mandibular condyle. 4. Hairline fracture of the left mandibular body just posterior to the last remaining tooth in the left mandibular body. 5. Chronic opacification of the mastoid air cells and middle ear cavities bilaterally. 6. Multilevel degenerative disc and joint disease  with spinal cord compression at C5-6 and C6-7 as described. Electronically Signed   By: Francene Boyers M.D.   On: 08/28/2019 19:49   Ct Maxillofacial Wo Contrast  Result Date: 08/28/2019 CLINICAL DATA:  Head and face trauma secondary to a fall at home. Headache. EXAM: CT HEAD WITHOUT CONTRAST CT MAXILLOFACIAL WITHOUT CONTRAST CT CERVICAL SPINE WITHOUT CONTRAST TECHNIQUE: Multidetector CT imaging of the head, cervical spine, and maxillofacial structures were performed using the standard protocol without intravenous contrast. Multiplanar CT image reconstructions of the cervical  spine and maxillofacial structures were also generated. COMPARISON:  CT scan of the head dated 08/22/2006 and CT scan of the neck dated 03/01/2014 FINDINGS: CT HEAD FINDINGS Brain: No evidence of acute infarction, hemorrhage, hydrocephalus, extra-axial collection or mass lesion/mass effect. Since the prior study of 2007 the patient has developed diffuse mild cerebral cortical atrophy with secondary ventricular dilatation. There is also new slight periventricular white matter lucency consistent with small vessel ischemic disease. Vascular: No hyperdense vessel or unexpected calcification. Skull: Normal. Negative for fracture or focal lesion. Other: Chronic opacification of the mastoid air cells with partial opacification of the middle ear cavities, chronic. The patient has developed mucosal thickening in the bases of both maxillary sinuses since the prior study. Orbits appear normal. CT MAXILLOFACIAL FINDINGS Osseous: There is a comminuted displaced fracture of the right mandibular condyle. There is also a hairline fracture through the left mandibular body just posterior to the last remaining tooth in the left mandibular body. Orbits: Old blowout fracture of floor of the left orbit. Old slight deformity of the left zygomatic arch consistent with an old fracture. Sinuses: Slight mucosal thickening in the maxillary sinuses. Chronic opacification of the mastoid air cells and chronic slight partial opacification of the middle ear cavities. Soft tissues: Negative. CT CERVICAL SPINE FINDINGS Alignment: Normal. Skull base and vertebrae: No acute fracture. No primary bone lesion or focal pathologic process. Soft tissues and spinal canal: No prevertebral fluid or swelling. No visible canal hematoma. Disc levels: C2-3: Slight bilateral facet arthritis. No disc bulging or protrusion. C3-4: Anterior osteophytes fuse the C3-4 level. No disc bulging or protrusion. Foraminal or spinal stenosis. C4-5: Marked disc space narrowing.  Broad-based disc osteophyte complex slightly narrows the right lateral recess. No significant foraminal stenosis. Central disc bulge which compresses the ventral aspect of the spinal cord. AP dimension of the cord at that level is approximately 6 mm. C5-6: Disc space narrowing. Central disc protrusion with prominent osteophytes to the right and left. The spinal cord is compressed to a minimum dimension of approximately 5 mm. Bilateral lateral recess stenosis and moderately severe bilateral foraminal stenosis. C6-7: Broad-based disc osteophyte complex without focal neural impingement. C7-T1: Tiny central disc bulge with no neural impingement. T1-2: Negative. T2-3: Negative. Upper chest: Emphysema. Other: None IMPRESSION: 1. No acute intracranial abnormality. 2. No acute abnormality of the cervical spine. 3. Comminuted displaced fracture of the right mandibular condyle. 4. Hairline fracture of the left mandibular body just posterior to the last remaining tooth in the left mandibular body. 5. Chronic opacification of the mastoid air cells and middle ear cavities bilaterally. 6. 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 Electronically Signed   By: Francene Boyers M.D.   On: 08/28/2019 19:49    Microbiology: Recent Results (from the past 240 hour(s))  SARS CORONAVIRUS 2 (TAT 6-24 HRS) Nasopharyngeal Nasopharyngeal Swab     Status: None   Collection Time: 08/28/19  9:15 PM   Specimen: Nasopharyngeal Swab  Result Value Ref Range Status   SARS Coronavirus 2 NEGATIVE NEGATIVE Final    Comment: (NOTE) SARS-CoV-2 target nucleic acids are NOT DETECTED. The SARS-CoV-2 RNA is generally detectable in upper and lower respiratory specimens during the acute phase of infection. Negative results do not preclude SARS-CoV-2 infection, do not rule out co-infections with other pathogens, and should not be used as the sole basis for treatment or other patient  management decisions. Negative results must be combined with clinical observations, patient history, and epidemiological information. The expected result is Negative. Fact Sheet for Patients: HairSlick.no Fact Sheet for Healthcare Providers: quierodirigir.com This test is not yet approved or cleared by the [place] FDA and  has been authorized for detection and/or diagnosis of SARS-CoV-2 by FDA under an Emergency Use Authorization (EUA). This EUA will remain  in effect (meaning this test can be used) for the duration of the COVID-19 declaration under Section 56 4(b)(1) of the Act, 21 U.S.C. section 360bbb-3(b)(1), unless the authorization is terminated or revoked sooner. Performed at [facility], [address], [place], [place] [address]      Labs: Basic Metabolic Panel: Recent Labs  Lab 08/28/19 1935 08/29/19 0339  NA 138 141  K 3.0* 3.7  CL 102 107  CO2 21* 23  GLUCOSE 106* 130*  BUN 21 24*  CREATININE 1.17 1.17  CALCIUM 9.7 9.4  MG  --  1.5*   Liver Function Tests: Recent Labs  Lab 08/28/19 1935  AST 55*  ALT 30  ALKPHOS 53  BILITOT 0.6  PROT 6.8  ALBUMIN 3.3*   No results for input(s): LIPASE, AMYLASE in the last 168 hours. No results for input(s): AMMONIA in the last 168 hours. CBC: Recent Labs  Lab 08/28/19 1935 08/29/19 0339  WBC 12.7* 9.2  NEUTROABS 10.5*  --   HGB 16.2 14.6  HCT 45.5 41.9  MCV 88.9 90.1  PLT 145* 117*   Cardiac Enzymes: No results for input(s): CKTOTAL, CKMB, CKMBINDEX, TROPONINI in the last 168 hours. BNP: BNP (last 3 results) No results for input(s): BNP in the last 8760 hours.  ProBNP (last 3 results) No results for input(s): PROBNP in the last 8760 hours.  CBG: No results for input(s): GLUCAP in the last 168 hours.     SignedGwenyth Bender NP [facility] 08/29/2019, 2:08 PM

## 2019-08-29 NOTE — ED Notes (Signed)
Tele   Breakfast ordered  

## 2019-08-29 NOTE — ED Notes (Signed)
Breakfast tray set up at bedside, pt declined assistance to feed.

## 2019-08-29 NOTE — Progress Notes (Signed)
RN gave pt discharge instructions and he stated understanding. Pt wife notified and she is coming to pick up the patient. IV's have been removed, waiting for TOC to drop off prescriptions

## 2019-08-29 NOTE — Progress Notes (Signed)
NT walked with patient in the hallway down and back, pt tolerated well did not get SOB or complain of any pain. PT O2 dropped to 89 and and went back up to 96 on room air.

## 2019-09-03 DIAGNOSIS — S0261XA Fracture of condylar process of mandible, initial encounter for closed fracture: Secondary | ICD-10-CM | POA: Diagnosis not present

## 2019-09-03 DIAGNOSIS — S0269XA Fracture of mandible of other specified site, initial encounter for closed fracture: Secondary | ICD-10-CM | POA: Diagnosis not present

## 2019-09-03 DIAGNOSIS — W19XXXA Unspecified fall, initial encounter: Secondary | ICD-10-CM | POA: Diagnosis not present

## 2019-09-03 DIAGNOSIS — S02611A Fracture of condylar process of right mandible, initial encounter for closed fracture: Secondary | ICD-10-CM | POA: Diagnosis not present

## 2019-09-05 DIAGNOSIS — S0269XA Fracture of mandible of other specified site, initial encounter for closed fracture: Secondary | ICD-10-CM | POA: Diagnosis not present

## 2019-09-05 DIAGNOSIS — S0261XA Fracture of condylar process of mandible, initial encounter for closed fracture: Secondary | ICD-10-CM | POA: Diagnosis not present

## 2019-09-05 DIAGNOSIS — W19XXXA Unspecified fall, initial encounter: Secondary | ICD-10-CM | POA: Diagnosis not present

## 2019-09-05 DIAGNOSIS — S02611A Fracture of condylar process of right mandible, initial encounter for closed fracture: Secondary | ICD-10-CM | POA: Diagnosis not present

## 2019-09-21 ENCOUNTER — Ambulatory Visit: Payer: Self-pay | Admitting: Oral Surgery

## 2019-09-22 ENCOUNTER — Other Ambulatory Visit (HOSPITAL_COMMUNITY)
Admission: RE | Admit: 2019-09-22 | Discharge: 2019-09-22 | Disposition: A | Discharge status: Home / Self Care | Payer: Medicare HMO | Source: Ambulatory Visit | Attending: Oral Surgery | Admitting: Oral Surgery

## 2019-09-22 DIAGNOSIS — Z01812 Encounter for preprocedural laboratory examination: Secondary | ICD-10-CM | POA: Diagnosis not present

## 2019-09-22 DIAGNOSIS — Z20828 Contact with and (suspected) exposure to other viral communicable diseases: Secondary | ICD-10-CM | POA: Diagnosis not present

## 2019-09-23 LAB — NOVEL CORONAVIRUS, NAA (HOSP ORDER, SEND-OUT TO REF LAB; TAT 18-24 HRS): SARS-CoV-2, NAA: NOT DETECTED

## 2019-09-24 ENCOUNTER — Encounter (HOSPITAL_COMMUNITY): Payer: Self-pay | Admitting: *Deleted

## 2019-09-24 ENCOUNTER — Other Ambulatory Visit: Payer: Self-pay

## 2019-09-24 NOTE — Progress Notes (Signed)
Patient denies shortness of breath, fever, cough and chest pain.  PCP - [facility] in [place], [place] Cardiologist - Dr Terrence Dupont  Chest x-ray - 08/28/19, 2 view EKG - 09/21/19 Stress Test - Lexiscan 04/24/18 ECHO - 10/04/14 Cardiac Cath -1988   Aspirin Instructions: Follow your surgeon's instructions on when to stop aspirin prior to surgery,  If no instructions were given by your surgeon then you will need to call the office for those instructions.  Anesthesia review: Yes  STOP now taking any Aspirin (unless otherwise instructed by your surgeon), Aleve, Naproxen, Ibuprofen, Motrin, Advil, Goody's, BC's, all herbal medications, fish oil, and all vitamins.   Coronavirus Screening Covid test on 09/22/19 was negative.  Patient verbalized understanding of instructions that were given via phone.

## 2019-09-25 NOTE — Progress Notes (Signed)
Anesthesia Chart Review: Same day workup  Follows with Dr. Terrence Dupont for hx of CAD s/p anteroseptal wall MI. Last seen 07/18/19. BP noted slightly elevated 149/92, metoprolol was increased. Otherwise denied CV symptoms. Echo 2018 showed EF 45-49%, normal valves. Nuclear stress 2019 showed large chronic scar with no inducible ischemia.   Pt is a SDW and will need DOS labs and eval.   EKG 08/29/19: Sinus rhythm with short PR. Rate 87. Septal infarct , age undetermined   Nuclear stress 04/24/18: IMPRESSION: 1. Large areas of chronic scarring in the inferior wall and septum as previously reported. No inducible ischemia.  2. Inferoseptal hypokinesis.  3. Left ventricular ejection fraction 53%. Previously 60%. Mildly progressive left ventricular dilatation.  4. Non invasive risk stratification*: Intermediate  Echo 10/26/2017 (copy on chart): Summary: Normal left ventricular size.  Left ventricular systolic function is mildly depressed with abnormal ejection fraction 45 to 49%. The left ventricle shows abnormal contractility with hypokinesis involving the anteroseptal wall. Trace aortic regurgitation is seen. Trace tricuspid regurgitation is seen. No intracardiac shunting is found. No intracardiac masses or thrombi are seen. No pericardial effusion is seen with no suggestion of pericardial tamponade.  Wynonia Musty [facility] Short Stay Center/Anesthesiology Phone [phone] 09/25/2019 12:50 PM

## 2019-09-25 NOTE — Anesthesia Preprocedure Evaluation (Addendum)
Anesthesia Evaluation  Patient identified by MRN, date of birth, ID band Patient awake    Reviewed: Allergy & Precautions, H&P , NPO status , Patient's Chart, lab work & pertinent test results  Airway Mallampati: II   Neck ROM: full  Mouth opening: Limited Mouth Opening  Dental   Pulmonary shortness of breath, Current Smoker,    breath sounds clear to auscultation       Cardiovascular hypertension, + Past MI   Rhythm:regular Rate:Normal  Stress test (2019): old ateriorseptal scar. No areas of inducible ischemia.  EF 45%   Neuro/Psych    GI/Hepatic GERD  ,  Endo/Other    Renal/GU      Musculoskeletal   Abdominal   Peds  Hematology   Anesthesia Other Findings   Reproductive/Obstetrics                            Anesthesia Physical Anesthesia Plan  ASA: III  Anesthesia Plan: General   Post-op Pain Management:    Induction: Intravenous  PONV Risk Score and Plan: 1 and Ondansetron, Dexamethasone and Treatment may vary due to age or medical condition  Airway Management Planned: Oral ETT  Additional Equipment:   Intra-op Plan:   Post-operative Plan: Extubation in OR  Informed Consent: I have reviewed the patients History and Physical, chart, labs and discussed the procedure including the risks, benefits and alternatives for the proposed anesthesia with the patient or authorized representative who has indicated his/her understanding and acceptance.       Plan Discussed with: CRNA, Anesthesiologist and Surgeon  Anesthesia Plan Comments: (Follows with Dr. Terrence Dupont for hx of CAD s/p anteroseptal wall MI. Last seen 07/18/19. BP noted slightly elevated 149/92, metoprolol was increased. Otherwise denied CV symptoms. Echo 2018 showed EF 45-49%, normal valves. Nuclear stress 2019 showed large chronic scar with no inducible ischemia.   Pt is a SDW and will need DOS labs and eval.   EKG  08/29/19: Sinus rhythm with short PR. Rate 87. Septal infarct , age undetermined   Nuclear stress 04/24/18: IMPRESSION: 1. Large areas of chronic scarring in the inferior wall and septum as previously reported. No inducible ischemia.  2. Inferoseptal hypokinesis.  3. Left ventricular ejection fraction 53%. Previously 60%. Mildly progressive left ventricular dilatation.  4. Non invasive risk stratification*: Intermediate  Echo 10/26/2017 (copy on chart): Summary: Normal left ventricular size.  Left ventricular systolic function is mildly depressed with abnormal ejection fraction 45 to 49%. The left ventricle shows abnormal contractility with hypokinesis involving the anteroseptal wall. Trace aortic regurgitation is seen. Trace tricuspid regurgitation is seen. No intracardiac shunting is found. No intracardiac masses or thrombi are seen. No pericardial effusion is seen with no suggestion of pericardial tamponade.)       Anesthesia Quick Evaluation

## 2019-09-26 ENCOUNTER — Ambulatory Visit: Payer: Self-pay | Admitting: Oral Surgery

## 2019-09-26 ENCOUNTER — Ambulatory Visit (HOSPITAL_COMMUNITY): Payer: Medicare HMO | Admitting: Physician Assistant

## 2019-09-26 ENCOUNTER — Encounter (HOSPITAL_COMMUNITY)
Admission: RE | Disposition: A | Discharge status: Home / Self Care | Payer: Self-pay | Source: Home / Self Care | Attending: Oral Surgery

## 2019-09-26 ENCOUNTER — Other Ambulatory Visit: Payer: Self-pay

## 2019-09-26 ENCOUNTER — Ambulatory Visit (HOSPITAL_COMMUNITY)
Admission: RE | Admit: 2019-09-26 | Discharge: 2019-09-26 | Disposition: A | Discharge status: Home / Self Care | Payer: Medicare HMO | Attending: Oral Surgery | Admitting: Oral Surgery

## 2019-09-26 ENCOUNTER — Encounter (HOSPITAL_COMMUNITY): Payer: Self-pay | Admitting: Certified Registered"

## 2019-09-26 DIAGNOSIS — W108XXA Fall (on) (from) other stairs and steps, initial encounter: Secondary | ICD-10-CM | POA: Diagnosis not present

## 2019-09-26 DIAGNOSIS — Z79899 Other long term (current) drug therapy: Secondary | ICD-10-CM | POA: Diagnosis not present

## 2019-09-26 DIAGNOSIS — S02611A Fracture of condylar process of right mandible, initial encounter for closed fracture: Secondary | ICD-10-CM | POA: Diagnosis not present

## 2019-09-26 DIAGNOSIS — I2510 Atherosclerotic heart disease of native coronary artery without angina pectoris: Secondary | ICD-10-CM | POA: Diagnosis not present

## 2019-09-26 DIAGNOSIS — I252 Old myocardial infarction: Secondary | ICD-10-CM | POA: Insufficient documentation

## 2019-09-26 DIAGNOSIS — J449 Chronic obstructive pulmonary disease, unspecified: Secondary | ICD-10-CM | POA: Diagnosis not present

## 2019-09-26 DIAGNOSIS — Z01818 Encounter for other preprocedural examination: Secondary | ICD-10-CM

## 2019-09-26 DIAGNOSIS — W19XXXA Unspecified fall, initial encounter: Secondary | ICD-10-CM | POA: Diagnosis not present

## 2019-09-26 DIAGNOSIS — F17210 Nicotine dependence, cigarettes, uncomplicated: Secondary | ICD-10-CM | POA: Diagnosis not present

## 2019-09-26 DIAGNOSIS — S02601A Fracture of unspecified part of body of right mandible, initial encounter for closed fracture: Secondary | ICD-10-CM | POA: Diagnosis not present

## 2019-09-26 DIAGNOSIS — I10 Essential (primary) hypertension: Secondary | ICD-10-CM | POA: Insufficient documentation

## 2019-09-26 DIAGNOSIS — J9600 Acute respiratory failure, unspecified whether with hypoxia or hypercapnia: Secondary | ICD-10-CM | POA: Diagnosis not present

## 2019-09-26 DIAGNOSIS — S02602A Fracture of unspecified part of body of left mandible, initial encounter for closed fracture: Secondary | ICD-10-CM | POA: Insufficient documentation

## 2019-09-26 DIAGNOSIS — K219 Gastro-esophageal reflux disease without esophagitis: Secondary | ICD-10-CM | POA: Diagnosis not present

## 2019-09-26 DIAGNOSIS — Z7982 Long term (current) use of aspirin: Secondary | ICD-10-CM | POA: Diagnosis not present

## 2019-09-26 DIAGNOSIS — S0269XA Fracture of mandible of other specified site, initial encounter for closed fracture: Secondary | ICD-10-CM | POA: Diagnosis not present

## 2019-09-26 HISTORY — DX: Presence of dental prosthetic device (complete) (partial): Z97.2

## 2019-09-26 HISTORY — DX: Presence of spectacles and contact lenses: Z97.3

## 2019-09-26 HISTORY — PX: CLOSED REDUCTION MANDIBLE: SHX5307

## 2019-09-26 LAB — BASIC METABOLIC PANEL
Anion gap: 14 (ref 5–15)
BUN: 14 mg/dL (ref 8–23)
CO2: 26 mmol/L (ref 22–32)
Calcium: 9.9 mg/dL (ref 8.9–10.3)
Chloride: 100 mmol/L (ref 98–111)
Creatinine, Ser: 1.37 mg/dL — ABNORMAL HIGH (ref 0.61–1.24)
GFR calc Af Amer: 58 mL/min — ABNORMAL LOW (ref >60)
GFR calc non Af Amer: 50 mL/min — ABNORMAL LOW (ref >60)
Glucose, Bld: 98 mg/dL (ref 70–99)
Potassium: 3.1 mmol/L — ABNORMAL LOW (ref 3.5–5.1)
Sodium: 140 mmol/L (ref 135–145)

## 2019-09-26 LAB — HEMOGLOBIN: Hemoglobin: 15.4 g/dL (ref 13.0–17.0)

## 2019-09-26 SURGERY — CLOSED REDUCTION, MANDIBLE
Anesthesia: General | Site: Mouth

## 2019-09-26 MED ORDER — OXYCODONE HCL 5 MG/5ML PO SOLN: 5.0000 mg | Freq: Once | ORAL | Status: DC | PRN

## 2019-09-26 MED ORDER — ONDANSETRON HCL 4 MG/2ML IJ SOLN: 4.0000 mg | Freq: Four times a day (QID) | INTRAMUSCULAR | Status: DC | PRN

## 2019-09-26 MED ORDER — FENTANYL CITRATE (PF) 250 MCG/5ML IJ SOLN
INTRAMUSCULAR | Status: AC
  Filled 2019-09-26: qty 5

## 2019-09-26 MED ORDER — PROPOFOL 10 MG/ML IV BOLUS
INTRAVENOUS | Status: AC
  Filled 2019-09-26: qty 20

## 2019-09-26 MED ORDER — LACTATED RINGERS IV SOLN
INTRAVENOUS | Status: DC
  Administered 2019-09-26: 08:00:00 via INTRAVENOUS

## 2019-09-26 MED ORDER — PHENYLEPHRINE HCL-NACL 10-0.9 MG/250ML-% IV SOLN
INTRAVENOUS | Status: DC | PRN
  Administered 2019-09-26: 40 ug/min via INTRAVENOUS

## 2019-09-26 MED ORDER — OXYCODONE HCL 5 MG PO TABS: 5.0000 mg | ORAL_TABLET | Freq: Once | ORAL | Status: DC | PRN

## 2019-09-26 MED ORDER — FENTANYL CITRATE (PF) 100 MCG/2ML IJ SOLN
INTRAMUSCULAR | Status: DC | PRN
  Administered 2019-09-26 (×3): 50 ug via INTRAVENOUS

## 2019-09-26 MED ORDER — ONDANSETRON HCL 4 MG/2ML IJ SOLN
INTRAMUSCULAR | Status: DC | PRN
  Administered 2019-09-26: 4 mg via INTRAVENOUS

## 2019-09-26 MED ORDER — SUGAMMADEX SODIUM 200 MG/2ML IV SOLN
INTRAVENOUS | Status: DC | PRN
  Administered 2019-09-26: 250 mg via INTRAVENOUS

## 2019-09-26 MED ORDER — LIDOCAINE-EPINEPHRINE 1 %-1:100000 IJ SOLN
INTRAMUSCULAR | Status: AC
  Filled 2019-09-26: qty 1

## 2019-09-26 MED ORDER — GLYCOPYRROLATE PF 0.2 MG/ML IJ SOSY
PREFILLED_SYRINGE | INTRAMUSCULAR | Status: AC
  Filled 2019-09-26: qty 1

## 2019-09-26 MED ORDER — PROPOFOL 10 MG/ML IV BOLUS
INTRAVENOUS | Status: DC | PRN
  Administered 2019-09-26: 120 mg via INTRAVENOUS

## 2019-09-26 MED ORDER — DEXAMETHASONE SODIUM PHOSPHATE 10 MG/ML IJ SOLN
INTRAMUSCULAR | Status: AC
  Filled 2019-09-26: qty 1

## 2019-09-26 MED ORDER — ALBUMIN HUMAN 5 % IV SOLN
INTRAVENOUS | Status: DC | PRN
  Administered 2019-09-26: 09:00:00 via INTRAVENOUS

## 2019-09-26 MED ORDER — NEOMYCIN-POLYMYXIN-DEXAMETH 3.5-10000-0.1 OP OINT
TOPICAL_OINTMENT | OPHTHALMIC | Status: AC
  Filled 2019-09-26: qty 3.5

## 2019-09-26 MED ORDER — METOPROLOL SUCCINATE ER 100 MG PO TB24
100.0000 mg | ORAL_TABLET | Freq: Every day | ORAL | Status: DC
  Administered 2019-09-26: 08:00:00 100 mg via ORAL
  Filled 2019-09-26: qty 1

## 2019-09-26 MED ORDER — FENTANYL CITRATE (PF) 100 MCG/2ML IJ SOLN: 25.0000 ug | INTRAMUSCULAR | Status: DC | PRN

## 2019-09-26 MED ORDER — CEFAZOLIN SODIUM 1 G IJ SOLR
INTRAMUSCULAR | Status: AC
  Filled 2019-09-26: qty 20

## 2019-09-26 MED ORDER — LIDOCAINE 2% (20 MG/ML) 5 ML SYRINGE
INTRAMUSCULAR | Status: DC | PRN
  Administered 2019-09-26: 40 mg via INTRAVENOUS

## 2019-09-26 MED ORDER — ESMOLOL HCL 100 MG/10ML IV SOLN
INTRAVENOUS | Status: AC
  Filled 2019-09-26: qty 10

## 2019-09-26 MED ORDER — ONDANSETRON HCL 4 MG/2ML IJ SOLN
INTRAMUSCULAR | Status: AC
  Filled 2019-09-26: qty 2

## 2019-09-26 MED ORDER — DEXAMETHASONE SODIUM PHOSPHATE 10 MG/ML IJ SOLN
INTRAMUSCULAR | Status: DC | PRN
  Administered 2019-09-26: 5 mg via INTRAVENOUS

## 2019-09-26 MED ORDER — LIDOCAINE 2% (20 MG/ML) 5 ML SYRINGE
INTRAMUSCULAR | Status: AC
  Filled 2019-09-26: qty 5

## 2019-09-26 MED ORDER — ROCURONIUM BROMIDE 100 MG/10ML IV SOLN
INTRAVENOUS | Status: DC | PRN
  Administered 2019-09-26: 50 mg via INTRAVENOUS

## 2019-09-26 MED ORDER — CEFAZOLIN SODIUM-DEXTROSE 2-3 GM-%(50ML) IV SOLR
INTRAVENOUS | Status: DC | PRN
  Administered 2019-09-26: 2 g via INTRAVENOUS

## 2019-09-26 MED ORDER — ESMOLOL HCL 100 MG/10ML IV SOLN
INTRAVENOUS | Status: DC | PRN
  Administered 2019-09-26 (×2): 20 mg via INTRAVENOUS
  Administered 2019-09-26: 10 mg via INTRAVENOUS

## 2019-09-26 MED ORDER — METOPROLOL SUCCINATE ER 25 MG PO TB24
ORAL_TABLET | ORAL | Status: AC
  Filled 2019-09-26: qty 4

## 2019-09-26 MED ORDER — ROCURONIUM BROMIDE 10 MG/ML (PF) SYRINGE
PREFILLED_SYRINGE | INTRAVENOUS | Status: AC
  Filled 2019-09-26: qty 10

## 2019-09-26 MED ORDER — PHENYLEPHRINE 40 MCG/ML (10ML) SYRINGE FOR IV PUSH (FOR BLOOD PRESSURE SUPPORT)
PREFILLED_SYRINGE | INTRAVENOUS | Status: DC | PRN
  Administered 2019-09-26: 20 ug via INTRAVENOUS
  Administered 2019-09-26: 120 ug via INTRAVENOUS
  Administered 2019-09-26: 80 ug via INTRAVENOUS
  Administered 2019-09-26: 120 ug via INTRAVENOUS

## 2019-09-26 MED ORDER — PHENYLEPHRINE 40 MCG/ML (10ML) SYRINGE FOR IV PUSH (FOR BLOOD PRESSURE SUPPORT)
PREFILLED_SYRINGE | INTRAVENOUS | Status: AC
  Filled 2019-09-26: qty 10

## 2019-09-26 MED ORDER — LIDOCAINE-EPINEPHRINE 1 %-1:100000 IJ SOLN
INTRAMUSCULAR | Status: DC | PRN
  Administered 2019-09-26: 10 mL

## 2019-09-26 MED ORDER — GLYCOPYRROLATE 0.2 MG/ML IJ SOLN
INTRAMUSCULAR | Status: DC | PRN
  Administered 2019-09-26 (×2): 0.1 mg via INTRAVENOUS

## 2019-09-26 SURGICAL SUPPLY — 34 items
BLADE SURG 15 STRL LF DISP TIS (BLADE) IMPLANT
BLADE SURG 15 STRL SS (BLADE)
CANISTER SUCT 3000ML PPV (MISCELLANEOUS) ×2 IMPLANT
COVER SURGICAL LIGHT HANDLE (MISCELLANEOUS) ×2 IMPLANT
COVER WAND RF STERILE (DRAPES) ×2 IMPLANT
DECANTER SPIKE VIAL GLASS SM (MISCELLANEOUS) ×2 IMPLANT
DRAPE HALF SHEET 40X57 (DRAPES) ×2 IMPLANT
GAUZE 4X4 16PLY RFD (DISPOSABLE) IMPLANT
GLOVE BIO SURGEON STRL SZ 6.5 (GLOVE) ×1 IMPLANT
GLOVE BIOGEL M 7.0 STRL (GLOVE) ×2 IMPLANT
GLOVE ORTHO TXT STRL SZ7.5 (GLOVE) ×1 IMPLANT
GOWN STRL REUS W/ TWL LRG LVL3 (GOWN DISPOSABLE) ×2 IMPLANT
GOWN STRL REUS W/TWL LRG LVL3 (GOWN DISPOSABLE) ×4
KIT BASIN OR (CUSTOM PROCEDURE TRAY) ×2 IMPLANT
KIT TURNOVER KIT B (KITS) ×2 IMPLANT
NDL HYPO 25GX1X1/2 BEV (NEEDLE) IMPLANT
NDL PRECISIONGLIDE 27X1.5 (NEEDLE) ×1 IMPLANT
NEEDLE HYPO 25GX1X1/2 BEV (NEEDLE) IMPLANT
NEEDLE PRECISIONGLIDE 27X1.5 (NEEDLE) ×2 IMPLANT
NS IRRIG 1000ML POUR BTL (IV SOLUTION) ×2 IMPLANT
PAD ARMBOARD 7.5X6 YLW CONV (MISCELLANEOUS) ×4 IMPLANT
SCREW UPPER FACE 2.0X12MM (Screw) ×1 IMPLANT
SOL PREP POV-IOD 4OZ 10% (MISCELLANEOUS) ×1 IMPLANT
STAPLER VISISTAT 35W (STAPLE) ×1 IMPLANT
SUCTION FRAZIER HANDLE 10FR (MISCELLANEOUS)
SUCTION TUBE FRAZIER 10FR DISP (MISCELLANEOUS) IMPLANT
SUT CHROMIC 3 0 PS 2 (SUTURE) ×2 IMPLANT
SUT CHROMIC 4 0 PS 2 18 (SUTURE) ×2 IMPLANT
SUT STEEL 4 (SUTURE) ×1 IMPLANT
SYR CONTROL 10ML LL (SYRINGE) ×2 IMPLANT
TRAY ENT MC OR (CUSTOM PROCEDURE TRAY) ×2 IMPLANT
TUBE CONNECTING 12X1/4 (SUCTIONS) ×2 IMPLANT
WATER STERILE IRR 1000ML POUR (IV SOLUTION) ×2 IMPLANT
YANKAUER SUCT BULB TIP NO VENT (SUCTIONS) ×2 IMPLANT

## 2019-09-26 NOTE — Op Note (Signed)
09/26/2019  9:43 AM  PATIENT:  Timothy Bowman  73 y.o. male  PRE-OPERATIVE DIAGNOSIS: BILATERAL MANDIBLE FRACTURE  POST-OPERATIVE DIAGNOSIS:  BILATERAL MANDIBLE FRACTURE  PROCEDURE:  Procedure(s): CLOSED REDUCTION BILATERAL MANDIBLE FRACTURE WITH MAXILLOMANDIBULAR FIXATION  SURGEON:  Surgeon(s) and Role:    * Marithza Malachi, DMD - Primary  ANESTHESIA:   general  EBL: <1OX  COMPLICATIONS: NONE  OPERATIVE FINDINGS: Maxillomandibular fixation stable with wire fixation. Occlusion stable.  Indications for procedure: 73 yo male s/p fall with displaced comminuted fracture of the right mandibular condyle and nondisplaced fracture of the left mandibular body.  Procedure: The patient was taken to the operating room and placed in the table in the supine position.  Standard ASA leads and monitors were placed.  The patient was preoxygenated, induced, and his airway was protected with a nasoendotracheal tube.  The tube was taped and secured by the anesthesia care team.  The patient was then rotated 90 degrees.  The patient was then prepped and draped for a standard maxillofacial procedure.  Attention was first directed in the bilateral temporal areas where passing awl was advanced into the superficial temporal space and passed medially to the zygoma and intraorally into the maxillary vestibule.  A 25-gauge wire was then inserted retracted and looped around the zygoma and passed back intraorally.  This was completed on both the right and left sides.  The wire passing awl was then utilized in the bilateral mandibular body areas to pass a circummandibular wire on the floor the mouth and vestibular sides.  Once this was completed the patient's maxillary denture was placed, his teeth were then guided into occlusion and the circumzygomatic and circummandibular wires were then utilized for maxillomandibular fixation.  An additional IMF 12 mm screw was placed in the anterior mandible and fixated with a  25-gauge wire to an already present maxillary IMF screw for further stabilization.  The patient's fixation was tested and noted to be stable.  The mouth and face were then thoroughly cleansed.  Bandages were placed over the extraoral access sites.  The patient was then returned to the anesthesia care team where he was extubated without event.  He was transported to the postanesthesia care unit for recovery and will be discharged home once meeting appropriate criteria.  [name], DMD Oral & Maxillofacial Surgery

## 2019-09-26 NOTE — Transfer of Care (Signed)
Immediate Anesthesia Transfer of Care Note  Patient: Timothy Bowman  Procedure(s) Performed: CLOSED REDUCTION MANDIBULAR (N/A Mouth)  Patient Location: PACU  Anesthesia Type:General  Level of Consciousness: awake and patient cooperative  Airway & Oxygen Therapy: Patient Spontanous Breathing and Patient connected to face mask oxygen  Post-op Assessment: Report given to RN and Post -op Vital signs reviewed and stable  Post vital signs: Reviewed and stable  Last Vitals:  Vitals Value Taken Time  BP 142/111 09/26/19 0953  Temp    Pulse 93 09/26/19 0958  Resp 15 09/26/19 0958  SpO2 100 % 09/26/19 0958  Vitals shown include unvalidated device data.  Last Pain:  Vitals:   09/26/19 0745  TempSrc:   PainSc: 0-No pain      Patients Stated Pain Goal: 3 (51/76/16 0737)  Complications: No apparent anesthesia complications

## 2019-09-26 NOTE — Anesthesia Procedure Notes (Signed)
Procedure Name: Intubation Date/Time: 09/26/2019 8:47 AM Performed by: Gwyndolyn Saxon, CRNA Pre-anesthesia Checklist: Patient identified, Emergency Drugs available, Suction available and Patient being monitored Patient Re-evaluated:Patient Re-evaluated prior to induction Oxygen Delivery Method: Circle system utilized Preoxygenation: Pre-oxygenation with 100% oxygen Induction Type: IV induction Ventilation: Mask ventilation without difficulty Laryngoscope Size: Miller and 2 Grade View: Grade I Nasal Tubes: Right, Nasal prep performed and Nasal Rae Tube size: 7.0 mm Number of attempts: 1 Placement Confirmation: ETT inserted through vocal cords under direct vision,  positive ETCO2 and breath sounds checked- equal and bilateral Tube secured with: Tape Dental Injury: Teeth and Oropharynx as per pre-operative assessment

## 2019-09-26 NOTE — Brief Op Note (Signed)
09/26/2019  9:43 AM  PATIENT:  Cherylann Ratel F Kassim  73 y.o. male  PRE-OPERATIVE DIAGNOSIS: BILATERAL MANDIBLE FRACTURE  POST-OPERATIVE DIAGNOSIS:  BILATERAL MANDIBLE FRACTURE  PROCEDURE:  Procedure(s): CLOSED REDUCTION MANDIBULAR (N/A)  SURGEON:  Surgeon(s) and Role:    * Breona Cherubin, DMD - Primary  ANESTHESIA:   general  EBL: <5CC  BLOOD ADMINISTERED:none  DRAINS: none   LOCAL MEDICATIONS USED:  LIDOCAINE   SPECIMEN:  No Specimen  DISPOSITION OF SPECIMEN:  N/A  COUNTS:  YES  TOURNIQUET:  * No tourniquets in log *  DICTATION: .Dragon Dictation  PLAN OF CARE: Discharge to home after PACU  PATIENT DISPOSITION:  PACU - hemodynamically stable.   Delay start of Pharmacological VTE agent (>24hrs) due to surgical blood loss or risk of bleeding: not applicable

## 2019-09-26 NOTE — Discharge Instructions (Signed)
1. Regular full liquid diet until further notice. 2. Continue Chlorhexidine rinses tid until further notice. 3. Brush three times daily.

## 2019-09-26 NOTE — H&P (Signed)
Timothy Bowman is an 73 y.o. male.   Chief Complaint: mandible fracture HPI: HPI: Timothy Bowman is a 72 y.o. male with medical history significant of COPD, hypertension, GERD, MI presenting to the hospital via EMS for evaluation after a fall.  Patient states he had 3 ounces of liquor and while going down steps lost balance and fell.  maxillofacial ct obtained showing fractures of the right condyle and left mandibular body. Denies jaw pain.  Past Medical History:  Diagnosis Date  . ETOH abuse   . GERD (gastroesophageal reflux disease)    diet controlled  . Hypertension   . Multiple fractures of mandible, closed ([place])   . Myocardial infarction ([place]) 1988   x 1 sees dr Langi yearly  . Shortness of breath    with exertion  . Tobacco abuse   . Wears glasses   . Wears partial dentures    upper    Past Surgical History:  Procedure Laterality Date  . BALLOON DILATION N/A 04/02/2014   Procedure: BALLOON DILATION;  Surgeon: Garlan Fair, MD;  Location: [facility];  Service: Endoscopy;  Laterality: N/A;  . COLONOSCOPY     colon polyps  . CORONARY ANGIOPLASTY  1988  . ESOPHAGOGASTRODUODENOSCOPY N/A 04/02/2014   Procedure: ESOPHAGOGASTRODUODENOSCOPY (EGD) with balloon dilation;  Surgeon: Garlan Fair, MD;  Location: [facility];  Service: Endoscopy;  Laterality: N/A;  . HERNIA REPAIR  yrs ago  . surgery on collarbone for fracture  yrs ago    No family history on file. Social History:  reports that he has been smoking cigarettes. He has a 29.00 pack-year smoking history. He has never used smokeless tobacco. He reports current alcohol use of about 4.0 standard drinks of alcohol per week. He reports that he does not use drugs.  Allergies: No Known Allergies  (Not in a hospital admission)   Results for orders placed or performed during the hospital encounter of 09/26/19 (from the past 48 hour(s))  Hemoglobin     Status: None   Collection Time: 09/26/19  6:19 AM  Result Value  Ref Range   Hemoglobin 15.4 13.0 - 17.0 g/dL    Comment: Performed at [facility], [address], [place], [place] [address]   No results found.  ROS: other than HPI, shortness of breath on exertion.  There were no vitals taken for this visit. Physical Exam  Physical Exam  Constitutional: He is oriented to person, place, and time. No distress.  HENT: PERRL, EOMI. No gross malocclusion. Edentulous maxilla, large dental bridge in the mandible, poor oral hygeine. ROM wnl. Cardiovascular: Normal rate, regular rhythm and intact distal pulses.  Pulmonary/Chest: Effort normal. He has wheezes. He has no rales.  Abdominal: Soft. Bowel sounds are normal. He exhibits no distension. There is no abdominal tenderness. There is no guarding.  Musculoskeletal:        General: No edema.  Neurological: He is alert and oriented to person, place, and time.  Skin: Skin is warm and dry. He is not diaphoretic.    Assessment/Plan 73 y/o M with diplaced right condylar fracture, nondiplaced left mandibular body fracture. Pt will be taken to the OR for closed reduction of bilateral mandible fractures with maxillomandibular fixation. R/B/A discussed.   Anesth request: nasal intubation.  Michael Litter, DMD 09/26/2019, 7:54 AM

## 2019-09-27 ENCOUNTER — Encounter (HOSPITAL_COMMUNITY): Payer: Self-pay | Admitting: Oral Surgery

## 2019-09-27 NOTE — Anesthesia Postprocedure Evaluation (Signed)
Anesthesia Post Note  Patient: Timothy Bowman  Procedure(s) Performed: CLOSED REDUCTION MANDIBULAR (N/A Mouth)     Patient location during evaluation: PACU Anesthesia Type: General Level of consciousness: awake and alert Pain management: pain level controlled Vital Signs Assessment: post-procedure vital signs reviewed and stable Respiratory status: spontaneous breathing, nonlabored ventilation, respiratory function stable and patient connected to nasal cannula oxygen Cardiovascular status: blood pressure returned to baseline and stable Postop Assessment: no apparent nausea or vomiting Anesthetic complications: no    Last Vitals:  Vitals:   09/26/19 1108 09/26/19 1114  BP: 115/84 (!) 134/91  Pulse: (!) 106   Resp: 13 13  Temp:  36.5 C  SpO2: 93% 100%    Last Pain:  Vitals:   09/26/19 1114  TempSrc:   PainSc: 5    Pain Goal: Patients Stated Pain Goal: 3 (09/26/19 0745)                 Penelope

## 2019-12-29 ENCOUNTER — Ambulatory Visit: Payer: Medicare HMO | Attending: Internal Medicine

## 2019-12-29 DIAGNOSIS — Z23 Encounter for immunization: Secondary | ICD-10-CM | POA: Insufficient documentation

## 2019-12-29 NOTE — Progress Notes (Signed)
   Covid-19 Vaccination Clinic  Name:  ROCKFORD LEINEN    MRN: [id number] DOB: 13-Dec-1945  12/29/2019  Mr. Commins was observed post Covid-19 immunization for 15 minutes without incidence. He was provided with Vaccine Information Sheet and instruction to access the V-Safe system.   Mr. Severt was instructed to call 911 with any severe reactions post vaccine: Marland Kitchen Difficulty breathing  . Swelling of your face and throat  . A fast heartbeat  . A bad rash all over your body  . Dizziness and weakness    Immunizations Administered    Name Date Dose VIS Date Route   Pfizer COVID-19 Vaccine 12/29/2019  8:14 AM 0.3 mL 10/19/2019 Intramuscular   Manufacturer: [employer], [employer]   Lot: [id number]   NDC: [address]-1

## 2020-01-21 DIAGNOSIS — S0261XA Fracture of condylar process of mandible, initial encounter for closed fracture: Secondary | ICD-10-CM | POA: Diagnosis not present

## 2020-01-22 ENCOUNTER — Ambulatory Visit: Payer: Medicare HMO | Attending: Internal Medicine

## 2020-01-22 DIAGNOSIS — Z23 Encounter for immunization: Secondary | ICD-10-CM

## 2020-01-22 NOTE — Progress Notes (Signed)
   Covid-19 Vaccination Clinic  Name:  Timothy Bowman    MRN: [id number] DOB: September 20, 1946  01/22/2020  Timothy Bowman was observed post Covid-19 immunization for 15 minutes without incident. He was provided with Vaccine Information Sheet and instruction to access the V-Safe system.   Timothy Bowman was instructed to call 911 with any severe reactions post vaccine: Marland Kitchen Difficulty breathing  . Swelling of face and throat  . A fast heartbeat  . A bad rash all over body  . Dizziness and weakness   Immunizations Administered    Name Date Dose VIS Date Route   Pfizer COVID-19 Vaccine 01/22/2020  8:57 AM 0.3 mL 10/19/2019 Intramuscular   Manufacturer: [employer], [employer]   Lot: [id number]   NDC: [address]-6

## 2021-12-27 ENCOUNTER — Encounter (HOSPITAL_COMMUNITY): Payer: Self-pay | Admitting: Internal Medicine

## 2021-12-27 ENCOUNTER — Observation Stay (HOSPITAL_COMMUNITY)
Admission: EM | Admit: 2021-12-27 | Discharge: 2022-01-06 | Disposition: E | Payer: Medicare PPO | Attending: Internal Medicine | Admitting: Internal Medicine

## 2021-12-27 ENCOUNTER — Other Ambulatory Visit: Payer: Self-pay

## 2021-12-27 DIAGNOSIS — I5032 Chronic diastolic (congestive) heart failure: Secondary | ICD-10-CM | POA: Diagnosis not present

## 2021-12-27 DIAGNOSIS — J449 Chronic obstructive pulmonary disease, unspecified: Secondary | ICD-10-CM | POA: Diagnosis not present

## 2021-12-27 DIAGNOSIS — Z515 Encounter for palliative care: Secondary | ICD-10-CM

## 2021-12-27 DIAGNOSIS — I469 Cardiac arrest, cause unspecified: Principal | ICD-10-CM

## 2021-12-27 DIAGNOSIS — I2510 Atherosclerotic heart disease of native coronary artery without angina pectoris: Secondary | ICD-10-CM | POA: Diagnosis not present

## 2021-12-27 DIAGNOSIS — R627 Adult failure to thrive: Secondary | ICD-10-CM | POA: Diagnosis not present

## 2021-12-27 DIAGNOSIS — W19XXXA Unspecified fall, initial encounter: Secondary | ICD-10-CM | POA: Diagnosis not present

## 2021-12-27 DIAGNOSIS — I110 Hypertensive heart disease with heart failure: Secondary | ICD-10-CM | POA: Insufficient documentation

## 2021-12-27 DIAGNOSIS — Z79899 Other long term (current) drug therapy: Secondary | ICD-10-CM | POA: Diagnosis not present

## 2021-12-27 DIAGNOSIS — Z7982 Long term (current) use of aspirin: Secondary | ICD-10-CM | POA: Insufficient documentation

## 2021-12-27 DIAGNOSIS — R0603 Acute respiratory distress: Secondary | ICD-10-CM | POA: Diagnosis present

## 2021-12-27 LAB — CBG MONITORING, ED: Glucose-Capillary: 77 mg/dL (ref 70–99)

## 2021-12-28 ENCOUNTER — Telehealth: Payer: Self-pay

## 2021-12-28 NOTE — Telephone Encounter (Signed)
CSW received a call from Ignacia Bayley with [facility]. Ms. Timothy Bowman was calling to verify when patient came into the emergency room for her open case with the patient.

## 2022-01-06 NOTE — ED Triage Notes (Addendum)
To ed via [facility] from home---h of end stage dementia- wife has been caring for him, On EMS arrival - call was for decline in condition, initial CBG- 56-  given D10- repeat CBG 204.  Became pulseless- CPR x 3 minutes, received 3 EPI, ROSC obtained--  On arrival pt GCS -6, very emaciated, resp assisted with blow by with BVM and resp.  Has IO x 2 bilateral tibias. Received 1000cc IV VS difficult to obtain on arrival, no palpable radial pulse, resp shallow, nontracking eyes, Pupils 3cm, sluggish  Dr. Bernette Mayers spoke with family- comfort care was decided on as plan going forward. Family at bedside.

## 2022-01-06 NOTE — ED Notes (Signed)
Pt status is unchanged- family remains at bedside

## 2022-01-06 NOTE — Progress Notes (Signed)
° °                                                                                °  Consultation Note Date: January 07, 2022   Patient Name: Timothy Bowman  DOB: Oct 28, 1946  MRN: [id number]  Age / Sex: 75 y.o., male  PCP: Rinaldo Cloud, MD Referring Physician: Gust Rung, DO  Reason for Consultation: Terminal care  HPI/Patient Profile: 76 y.o. male  with past medical history of CAD, CHF, COPD, HLD, HTN, admitted on 07-Jan-2022 with cardiac arrest s/p CPR. Decision made with attending team to proceed with comfort measures only. Palliative medicine consulted for terminal care.    Primary Decision Maker NEXT OF KIN- spouse  Discussion: Chart reviewed- report received from attending providers. Spouse and daughter at bedside.  Evaluated patient. Cachectic. Apneic, no pulse, no heart sounds. Eyes fixed, not blinking. Discussed with family and they confirmed DNR and comfort measures only with no escalation of care.  I removed the NRB mask.  Per tele he had some electric activity- PEA.  Auscultated heart for 60 seconds in two different locations. This was also confirmed by his RN.  Pronounced death at 1410. Informed family.  Gave emotional support to family. Discussed next steps regarding post mortem care. They would like to remain with him for a little while and will notify his nurse.   Code Status/Advance Care Planning: DNR   Discharge Planning:  hospital death  Primary Diagnoses: Present on Admission:  Cardiac arrest, cause unspecified (HCC)   Review of Systems  Unable to perform ROS: Acuity of condition   Physical Exam Vitals and nursing note reviewed.  Constitutional:      Comments: cachectic  Cardiovascular:     Comments: Heart sounds and pulse absent Pulmonary:     Comments: apneic Skin:    General: Skin is warm and dry.    Vital Signs: Ht 5\' 7"  (1.702 m)    Wt 36.3 kg    BMI 12.53 kg/m          SpO2:   O2 Device:  O2 Flow Rate: .   IO: Intake/output summary:   Intake/Output Summary (Last 24 hours) at 01/07/22 1419 Last data filed at 07-Jan-2022 1108 Gross per 24 hour  Intake 1000 ml  Output --  Net 1000 ml    LBM:   Baseline Weight: Weight: 36.3 kg Most recent weight: Weight: 36.3 kg        Thank you for this consult. Palliative medicine will continue to follow and assist as needed.   Signed by: 12/29/2021, AGNP-C Palliative Medicine    Please contact Palliative Medicine Team phone at [phone] for questions and concerns.  For individual provider: See [phone]

## 2022-01-06 NOTE — ED Provider Notes (Signed)
[facility] EMERGENCY DEPARTMENT Provider Note  CSN: [id number] Arrival Date & Time: 2021/12/29  1026   History/HPI:  Chief Complaint  Patient presents with   post cpr    Timothy Bowman who presents today in acute distress.  Family reportedly found patient down today.  He was unresponsive.  EMS provided history.  Patient was found down.  He became unresponsive and lost pulses in route.  He received 1 round of CPR and a dose of epinephrine.  Pulses returned.  He continued to be unresponsive for them.  HPI and ROS are limited by patient's nonverbal status and the acuity of his condition.  Home Meds: Prior to Admission medications   Medication Sig Start Date End Date Taking? Authorizing Provider  albuterol (PROVENTIL HFA;VENTOLIN HFA) 108 (90 BASE) MCG/ACT inhaler Inhale 1 puff into the lungs every 6 (six) hours as needed for wheezing or shortness of breath.    [provider]  amLODipine (NORVASC) 10 MG tablet Take 10 mg by mouth every morning.    [provider]  aspirin EC 81 MG tablet Take 81 mg by mouth daily.    [provider]  baclofen (LIORESAL) 10 MG tablet Take 5 mg by mouth daily.    [provider]  chlorhexidine (PERIDEX) 0.12 % solution Use as directed 15 mLs in the mouth or throat 4 (four) times daily. Patient taking differently: Use as directed 15 mLs in the mouth or throat 3 (three) times daily.  08/29/19   Bowman, Timothy Chris, NP  cholecalciferol (VITAMIN D) 1000 UNITS tablet Take 1,000 Units by mouth daily.    [provider]  guaifenesin (HUMIBID E) 400 MG TABS tablet Take 400 mg by mouth 2 (two) times daily.    [provider]  hydrochlorothiazide (HYDRODIURIL) 25 MG tablet Take 12.5 mg by mouth daily.    [provider]  losartan (COZAAR) 100 MG tablet Take 100 mg by mouth daily.    [provider]  metoprolol succinate (TOPROL-XL) 100 MG 24 hr tablet Take 100 mg by mouth daily. Take  with or immediately following a meal.    [provider]  Omega-3 300 MG CAPS Take 300 mg by mouth daily.    [provider]    Allergies: Patient has no known allergies.  ROS: Review of Systems  Unable to perform ROS: Acuity of condition   Physical Exam: Physical Exam Constitutional:      General: He is in acute distress.     Appearance: He is cachectic. He is ill-appearing and toxic-appearing.  HENT:     Head: Normocephalic and atraumatic.     Mouth/Throat:     Mouth: Mucous membranes are dry.  Eyes:     Comments: Pupils minimally reactive.  Unable to assess EOMs as he is unable to follow commands.  Cardiovascular:     Rate and Rhythm: Normal rate and regular rhythm.  Pulmonary:     Effort: Respiratory distress present.     Breath sounds: Rhonchi present.  Abdominal:     General: Abdomen is flat.  Musculoskeletal:     Right lower leg: No edema.     Left lower leg: No edema.  Skin:    Comments: Cool to the touch  Neurological:     Comments: Eyes are open.  Not responsive.  Not withdrawing to pain.    Procedures: Procedures   MDM/ED Course:  Patient is 76 year old male who presented in acute distress.  He was minimally  responsive.  He received CPR in route.  He was breathing on his own arrival.  He had femoral pulses.  Difficulty assessing blood pressure, but felt to be at best 80/40.  He was withdrawing to pain.  No purposeful movements.  Patient was an extremis.  Differential includes but not limited to head bleed, sepsis, ketosis, metabolic encephalopathy.  Prior to initiating any further measures, discussion had with family regarding what patient would like [place] to do if you are able to tell us.  Family felt the patient had been declining for a while.  They knew that he was getting close to dying.  They did not want any life-saving measures performed.  They wanted patient to be comfortable.  Patient made comfort care.  No further interventions.  No  laboratory studies were drawn.  No imaging studies obtained.  Patient diet here in the emergency department.  Palliative care was at the bedside when patient passed away.  Please see their documentation regarding time of death.   Patient seen in conjunction with my attending, Dr. Bernette Mayers.  Clinical Impression/Dx: Final diagnoses:  Cardiac arrest [facility])  Fall, initial encounter  Failure to thrive in adult     Rx/Dc Orders: ED Discharge Orders     None            Timothy Simon, MD 22-Jan-2022 1548    Timothy Savoy, MD 12/28/21 1536

## 2022-01-06 NOTE — ED Provider Notes (Addendum)
Patient seen and examined. Agree with assessment and plan by Resident. Patient with multiple medical problems brought from home with AMS, brief CPR enroute. Family (wife and daughter) state he has been gradually getting worse for the last couple of weeks. Has stopped eating, drinking or taking any of his medications. Found on the floor today. They would like him to be Comfort Care only. He is now DNR/DNI.   Medical Decision Making Problems Addressed: Cardiac arrest [facility]): acute illness or injury that poses a threat to life or bodily functions  Risk Decision not to resuscitate or to de-escalate care because of poor prognosis.  Critical Care Total time providing critical care: 30-74 minutes     [name], MD 12/28/21 1535

## 2022-01-06 NOTE — ED Notes (Signed)
Family is at bedside- pt is comfort care only- chaplain at bedside also- pt does respond to verbal by following with his eyes

## 2022-01-06 NOTE — Progress Notes (Signed)
°   January 22, 2022 1055  Clinical Encounter Type  Visited With Health care provider;Patient and family together  Visit Type ED;Patient actively dying;Spiritual support;Initial  Referral From Nurse  Consult/Referral To Chaplain  Spiritual Encounters  Spiritual Needs Grief support;Emotional;Prayer   Responded to page in [place]. E.D. Room 19 to meet with the family of Timothy Bowman who is near the end of life. The patient's primary nurse stated that the patient is post-CPR and nearing the end of life. Patient has a decreased level of consciousness and does not respond to my voice. Met with patient's wife, Timothy Bowman, and daughter, Timothy Bowman in patient room. Mrs. Jumper states that she found [name] on the floor and unresponsive this morning. She then called 911 for assistance. Patient was transported via EMS.   Mrs. Ladnier states that the patient was previously active in his church but has fallen away in recent years. She asked for reading of Scripture, along with prayers of comfort and reassurance. Timothy Bowman told me about [name]'s life as a good husband and father. 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 Daughter Timothy Bowman is their only living child as her brother died in Jan 13, 2008 while serving at [place], [place]. Timothy Bowman spoke about how her "Daddy" cared for her and nursed her to health when she had a kidney transplant in 01-12-2014. Timothy Bowman was actively contacting other family members by phone inviting them to come and say goodbye.    The nurse will page if further assistance is requested by family. Chaplain Odette Watanabe, M.Min., [phone].

## 2022-01-06 NOTE — H&P (Signed)
Date: January 26, 2022               Patient Name:  Timothy Bowman MRN: [id number]  DOB: 1945-11-14 Age / Sex: 76 y.o., male   PCP: Charolette Forward, MD         Medical Service: Internal Medicine Teaching Service         Attending Physician: Dr. Lucious Groves, DO    First Contact: Dr. Marlou Sa Pager: [phone]  Second Contact: Dr. Johnney Ou  Pager: [phone]       After Hours (After 5p/  First Contact Pager: [phone]  weekends / holidays): Second Contact Pager: [phone]   Chief Complaint: post-cardiac arrest  History of Present Illness: Timothy Bowman is a 76 y.o. gentleman with past medical history of CAD, chronic diastolic heart failure, COPD, HLD, HTN, monoclonal gammopathy, multiple lung nodules, history of MI, severe protein calorie malnutrition who was BIB EMS and suffered from cardiac arrest requiring 3 minutes of CPR and 3 rounds of epinephrine en route admitted for post-cardiac arrest comfort care.  His wife Timothy Bowman and daughter Timothy Bowman are at bedside and provide remote history.  Timothy Bowman says that over the last week he has not been taking his medications or eating however he does drink water at times.  He no longer goes upstairs in their two-story home and she checks in on him frequently throughout the day.  She says that he is a patient at the [place] and that he refused letting family go with him to appointments so she has not entirely sure of the state of his health or any recent changes.  Last known normal was last night around 1130 and at that time he was able to answer some questions but seemed out of it overall.  This morning around 830 she went to check on him and he was on the floor with his mouth and eyes open but unable to respond.  She called 911 at that point to have him brought to the hospital.  On chart review of [facility] documents I can see that his wife has attempted to expedite an evaluation of Timothy Bowman over the last several weeks leading up to today.  There are several mentions of  concerns regarding self-neglect including refusing to eat and not showering for at least 3 months, as well as not making his family aware when he is soiled himself and needing to be cleaned.  It seems like Adult Protective Services were contacted on 02/15 regarding these concerns.  Information in this note was obtained from care everywhere and the patient's family.  Meds: Patient has not been taking his medications for at least 1 week per family.  Chart review reveals he is prescribed the following: Guaifenesin 400 mg p.o. twice daily Baclofen 5 mg p.o. daily Metoprolol succinate 100 mg p.o. daily Omega-3 300 mg p.o. daily HCTZ 12.5 mg p.o. daily Losartan 100 mg p.o. daily Peridex solution as needed in the mouth or throat 4 times daily Albuterol 1 puff every 6 hours as needed Amlodipine 10 mg p.o. daily Cholecalciferol 1000 units p.o. daily Aspirin 81 mg p.o. daily  Allergies: Allergies as of 26-Jan-2022   (No Known Allergies)   Past Medical History:  Diagnosis Date   ETOH abuse    GERD (gastroesophageal reflux disease)    diet controlled   Hypertension    Multiple fractures of mandible, closed (HCC)    Myocardial infarction ([place]) 1988   x 1 sees dr Cayer yearly  Shortness of breath    with exertion   Tobacco abuse    Wears glasses    Wears partial dentures    upper    Family History: Unable to obtain.  Social History: Patient lives at home with his wife Timothy Bowman in a two-story home.  He does not go upstairs.  He is completely dependent on others for ADLs.  He was in the [employer].  Review of Systems: Unable to obtain.  Physical Exam: Height 5\' 7"  (1.702 m), weight 36.3 kg. Constitutional: Emaciated, chronically ill gentleman in no acute distress. Eyes: Pupils are fixed, center, nonreactive. Cardio: Did not appreciate heart sounds.  Thready carotid pulse.  Not able to palpate radial or DP pulse. Pulm: Agonal breathing. Abdomen: Abdomen is scaphoid and soft. MSK:  Prominent clavicles with sunken temporal region of the skull.  Bony prominences over her whole body. Skin: Cool and dry. Neuro: GCS 3.  Pupils are fixed and nonreactive.  EKG: personally reviewed my interpretation is none obtained.  CXR: personally reviewed my interpretation is none obtained.  Assessment & Plan by Problem: Principal Problem:   Cardiac arrest, cause unspecified ([place])  #Cardiac arrest Patient was BIB EMS after family called due to decline in condition. He suffered from cardiac arrest en route requiring 3 minutes of CPR and three rounds of epinephrine, ROSC was obtained. On arrival to ED he had a GCS 6, no palpable radial pulse, shallow respirations, non-tracking eyes with 3 mm and sluggish pupils. He is s/p 1L IVF. At this time no admission labs have been collected as family who is at bedside has requested comfort care only with DNR/DNI status. My exam was remarkable for GCS of 3 with no change in nonpalpable peripheral pulses and agonal breathing. -Palliative care consulted, appreciate their assistance  Dispo: Admit patient to Observation with expected length of stay less than 2 midnights.  Signed: Farrel Gordon, DO January 03, 2022, 1:56 PM  Pager: [phone] After 5pm on weekdays and 1pm on weekends: On Call pager: [phone]

## 2022-01-06 NOTE — ED Notes (Signed)
Pt was pronounced by Palliative Care NP- family and chaplain at bedside.

## 2022-01-06 NOTE — Death Summary Note (Signed)
°  Name: Timothy Bowman MRN: [id number] DOB: Dec 23, 1945 76 y.o.  Date of Admission: 2021-12-30 10:26 AM Date of Discharge: 30-Dec-2021 Attending Physician: Dr. Mikey Bussing  Discharge Diagnosis: Principal Problem:   Cardiac arrest, cause unspecified (HCC)  Cause of death: Cardiac arrest Time of death: 1410  Disposition and follow-up:   Mr.[name] was discharged from [facility] in expired condition.    Hospital Course: Patient was BIB EMS after family called due to decline in condition. He suffered from cardiac arrest en route requiring 3 minutes of CPR and three rounds of epinephrine, ROSC was obtained. On arrival to ED he had a GCS 6, no palpable radial pulse, shallow respirations, non-tracking eyes with 3 mm and sluggish pupils. He is s/p 1L IVF. On arrival his family who was at bedside requested comfort care only with DNR/DNI status. My exam was remarkable for GCS of 3 with fixed and non-reactive pupils, no heart sounds with thready carotid pulse and nonpalpable radial and DP pulses, agonal breathing, emaciated habitus, cool and dry skin. Palliative care was consulted however on their examination he was found to be deceased with time of death called at 1410.  Signed: Champ Mungo, DO 30-Dec-2021, 6:16 PM

## 2022-01-06 NOTE — Progress Notes (Signed)
°   01/12/2022 1406  Clinical Encounter Type  Visited With Health care provider;Family  Visit Type ED;Death;Follow-up  Referral From Nurse  Consult/Referral To Chaplain   Offered condolences to Timothy Bowman and daughter. Escorted friends from waiting room to meet with Timothy Bowman. Patient will be taken to [facility], [address] [place], [place]. Attn: Timothy Bowman, [phone].   Chaplain Yanissa Michalsky, M.Min. [phone].

## 2022-01-06 DEATH — deceased
